# Patient Record
Sex: Male | Born: 1950 | Race: Black or African American | Hispanic: No | Marital: Married | Smoking: Never smoker
Health system: Southern US, Community
[De-identification: ages and names within clinical notes are randomized; demographics above are authoritative.]

## PROBLEM LIST (undated history)

## (undated) DIAGNOSIS — R7303 Prediabetes: Secondary | ICD-10-CM

## (undated) DIAGNOSIS — J45909 Unspecified asthma, uncomplicated: Secondary | ICD-10-CM

## (undated) DIAGNOSIS — E785 Hyperlipidemia, unspecified: Secondary | ICD-10-CM

## (undated) DIAGNOSIS — I10 Essential (primary) hypertension: Secondary | ICD-10-CM

## (undated) DIAGNOSIS — I4891 Unspecified atrial fibrillation: Secondary | ICD-10-CM

## (undated) HISTORY — DX: Essential (primary) hypertension: I10

## (undated) HISTORY — DX: Hyperlipidemia, unspecified: E78.5

---

## 2004-12-11 ENCOUNTER — Ambulatory Visit: Payer: Self-pay | Admitting: Unknown Physician Specialty

## 2007-12-01 ENCOUNTER — Other Ambulatory Visit: Payer: Self-pay

## 2007-12-01 ENCOUNTER — Ambulatory Visit: Payer: Self-pay | Admitting: Ophthalmology

## 2007-12-22 ENCOUNTER — Ambulatory Visit: Payer: Self-pay | Admitting: Ophthalmology

## 2010-05-09 ENCOUNTER — Ambulatory Visit: Payer: Self-pay | Admitting: Unknown Physician Specialty

## 2010-05-10 LAB — PATHOLOGY REPORT

## 2010-07-24 ENCOUNTER — Ambulatory Visit: Payer: Self-pay | Admitting: General Practice

## 2010-08-29 ENCOUNTER — Ambulatory Visit: Payer: Self-pay | Admitting: General Practice

## 2012-04-22 DIAGNOSIS — I10 Essential (primary) hypertension: Secondary | ICD-10-CM | POA: Insufficient documentation

## 2012-04-22 DIAGNOSIS — J329 Chronic sinusitis, unspecified: Secondary | ICD-10-CM | POA: Insufficient documentation

## 2012-04-22 DIAGNOSIS — E785 Hyperlipidemia, unspecified: Secondary | ICD-10-CM | POA: Insufficient documentation

## 2012-05-13 ENCOUNTER — Ambulatory Visit: Payer: Self-pay | Admitting: Ophthalmology

## 2012-05-13 LAB — POTASSIUM: Potassium: 4.1 mmol/L (ref 3.5–5.1)

## 2012-05-19 ENCOUNTER — Ambulatory Visit: Payer: Self-pay | Admitting: Ophthalmology

## 2013-10-15 ENCOUNTER — Ambulatory Visit: Payer: Self-pay | Admitting: General Practice

## 2014-12-28 NOTE — Op Note (Signed)
PATIENT NAME:  Mike Miller, Mike Miller MR#:  [id number] DATE OF BIRTH:  12/17/1950  DATE OF PROCEDURE:  05/19/2012  PREOPERATIVE DIAGNOSIS:  Cataract, right eye.   POSTOPERATIVE DIAGNOSIS:  Cataract, right eye.  PROCEDURE PERFORMED:  Extracapsular cataract extraction using phacoemulsification with placement of an Alcon [id number], 18.0-diopter posterior chamber lens, serial #[id number].  SURGEON:  Maylon PeppersSteven A. , MD  ASSISTANT:  None.  ANESTHESIA:  4% lidocaine and 0.75% Marcaine in a 50/50 mixture with 10 units/mL of Hyalex added, given as a peribulbar.   ANESTHESIOLOGIST: Dr. Dimple Caseyice.   COMPLICATIONS:  None.  ESTIMATED BLOOD LOSS:  Less than 1 ml.  DESCRIPTION OF PROCEDURE:  The patient was brought to the operating room and given a peribulbar block.  The patient was then prepped and draped in the usual fashion.  The vertical rectus muscles were imbricated using 5-0 silk sutures.  These sutures were then clamped to the sterile drapes as bridle sutures.  A limbal peritomy was performed extending two clock hours and hemostasis was obtained with cautery.  A partial thickness scleral groove was made at the surgical limbus and dissected anteriorly in a lamellar dissection using an Alcon crescent knife.  The anterior chamber was entered superonasally with a Superblade and through the lamellar dissection with a 2.6 mm keratome.  DisCoVisc was used to replace the aqueous and a continuous tear capsulorrhexis was carried out.  Hydrodissection and hydrodelineation were carried out with balanced salt and a 27 gauge canula.  The nucleus was rotated to confirm the effectiveness of the hydrodissection.  Phacoemulsification was carried out using a divide-and-conquer technique.  Total ultrasound time was one minute and 10.6 seconds with an average power of 13.8 percent.  CDE of 20.54.   Irrigation/aspiration was used to remove the residual cortex.  DisCoVisc was used to inflate the capsule and the internal  incision was enlarged to 3 mm with the crescent knife.  The intraocular lens was folded and inserted into the capsular bag using the AcrySert delivery system.  Irrigation/aspiration was used to remove the residual DisCoVisc.  Miostat was injected into the anterior chamber through the paracentesis track to inflate the anterior chamber and induce miosis.  The wound was checked for leaks and none were found. The conjunctiva was closed with cautery and the bridle sutures were removed.  Two drops of 0.3% Vigamox were placed on the eye.   An eye shield was placed on the eye.  The patient was discharged to the recovery room in good condition.   ____________________________ Maylon PeppersSteven A. , MD sad:ap D: 05/19/2012 13:39:17 ET T: 05/19/2012 14:04:00 ET JOB#: [id number]  cc: Viviann SpareSteven A. , MD, <Dictator> Erline LevineSTEVEN A  MD ELECTRONICALLY SIGNED 06/09/2012 12:33

## 2015-05-27 ENCOUNTER — Encounter: Payer: Self-pay | Admitting: *Deleted

## 2015-05-30 ENCOUNTER — Ambulatory Visit: Payer: BLUE CROSS/BLUE SHIELD | Admitting: *Deleted

## 2015-05-30 ENCOUNTER — Ambulatory Visit
Admission: RE | Admit: 2015-05-30 | Discharge: 2015-05-30 | Disposition: A | Discharge status: Home / Self Care | Payer: BLUE CROSS/BLUE SHIELD | Source: Ambulatory Visit | Attending: Unknown Physician Specialty | Admitting: Unknown Physician Specialty

## 2015-05-30 ENCOUNTER — Encounter
Admission: RE | Disposition: A | Discharge status: Home / Self Care | Payer: Self-pay | Source: Ambulatory Visit | Attending: Unknown Physician Specialty

## 2015-05-30 DIAGNOSIS — Z7982 Long term (current) use of aspirin: Secondary | ICD-10-CM | POA: Insufficient documentation

## 2015-05-30 DIAGNOSIS — Z79899 Other long term (current) drug therapy: Secondary | ICD-10-CM | POA: Diagnosis not present

## 2015-05-30 DIAGNOSIS — K640 First degree hemorrhoids: Secondary | ICD-10-CM | POA: Insufficient documentation

## 2015-05-30 DIAGNOSIS — J45909 Unspecified asthma, uncomplicated: Secondary | ICD-10-CM | POA: Diagnosis not present

## 2015-05-30 DIAGNOSIS — Z7951 Long term (current) use of inhaled steroids: Secondary | ICD-10-CM | POA: Insufficient documentation

## 2015-05-30 DIAGNOSIS — Z1211 Encounter for screening for malignant neoplasm of colon: Secondary | ICD-10-CM | POA: Diagnosis present

## 2015-05-30 DIAGNOSIS — Z8371 Family history of colonic polyps: Secondary | ICD-10-CM | POA: Insufficient documentation

## 2015-05-30 HISTORY — PX: COLONOSCOPY WITH PROPOFOL: SHX5780

## 2015-05-30 HISTORY — DX: Unspecified asthma, uncomplicated: J45.909

## 2015-05-30 SURGERY — COLONOSCOPY WITH PROPOFOL
Anesthesia: General

## 2015-05-30 MED ORDER — PROPOFOL INFUSION 10 MG/ML OPTIME
INTRAVENOUS | Status: DC | PRN
  Administered 2015-05-30: 100 ug/kg/min via INTRAVENOUS

## 2015-05-30 MED ORDER — SODIUM CHLORIDE 0.9 % IV SOLN
INTRAVENOUS | Status: DC
Start: 2015-05-30 — End: 2015-05-30
  Administered 2015-05-30: 1000 mL via INTRAVENOUS

## 2015-05-30 MED ORDER — PROPOFOL 10 MG/ML IV BOLUS
INTRAVENOUS | Status: DC | PRN
  Administered 2015-05-30: 30 mg via INTRAVENOUS
  Administered 2015-05-30: 20 mg via INTRAVENOUS

## 2015-05-30 MED ORDER — SODIUM CHLORIDE 0.9 % IV SOLN: INTRAVENOUS | Status: DC

## 2015-05-30 MED ORDER — LIDOCAINE HCL (PF) 2 % IJ SOLN
INTRAMUSCULAR | Status: DC | PRN
  Administered 2015-05-30: 50 mg

## 2015-05-30 MED ORDER — LIDOCAINE HCL (PF) 1 % IJ SOLN
2.0000 mL | Freq: Once | INTRAMUSCULAR | Status: AC
  Administered 2015-05-30: 0.3 mL via INTRADERMAL

## 2015-05-30 MED ORDER — LIDOCAINE HCL (PF) 1 % IJ SOLN
INTRAMUSCULAR | Status: AC
  Administered 2015-05-30: 0.3 mL via INTRADERMAL
  Filled 2015-05-30: qty 2

## 2015-05-30 MED ORDER — EPHEDRINE SULFATE 50 MG/ML IJ SOLN
INTRAMUSCULAR | Status: DC | PRN
  Administered 2015-05-30: 10 mg via INTRAVENOUS

## 2015-05-30 MED ORDER — FENTANYL CITRATE (PF) 100 MCG/2ML IJ SOLN
INTRAMUSCULAR | Status: DC | PRN
  Administered 2015-05-30: 50 ug via INTRAVENOUS

## 2015-05-30 MED ORDER — MIDAZOLAM HCL 5 MG/5ML IJ SOLN
INTRAMUSCULAR | Status: DC | PRN
  Administered 2015-05-30: 1 mg via INTRAVENOUS

## 2015-05-30 NOTE — Op Note (Addendum)
[facility] Gastroenterology Patient Name: Mike Miller Procedure Date: 05/30/2015 10:47 AM MRN: [id number] Account #: [id number] Date of Birth: 1951-05-27 Admit Type: Outpatient Age: 64 Room: [facility] ENDO ROOM 1 Gender: Male Note Status: Supervisor Override Procedure:         Colonoscopy Indications:       Colon cancer screening in patient at increased risk:                     Family history of colon polyps Providers:         Scot Jun, MD Medicines:         Propofol per Anesthesia Complications:     No immediate complications. Procedure:         Pre-Anesthesia Assessment:                    - After reviewing the risks and benefits, the patient was                     deemed in satisfactory condition to undergo the procedure.                    After obtaining informed consent, the colonoscope was                     passed under direct vision. Throughout the procedure, the                     patient's blood pressure, pulse, and oxygen saturations                     were monitored continuously. The Colonoscope was                     introduced through the anus and advanced to the the cecum,                     identified by appendiceal orifice and ileocecal valve. The                     colonoscopy was performed without difficulty. The patient                     tolerated the procedure well. The quality of the bowel                     preparation was excellent. Findings:      The scope definitely reacheed the cecum but I forgot to take a picture.      Internal hemorrhoids were found during endoscopy. The hemorrhoids were       small and Grade I (internal hemorrhoids that do not prolapse).      The exam was otherwise without abnormality. Impression:        - Internal hemorrhoids.                    - The examination was otherwise normal.                    - No specimens collected. Recommendation:    - Repeat colonoscopy in 5 years for screening  purposes. Scot Jun, MD 05/30/2015 11:15:13 AM This report has been signed electronically. Number of Addenda: 0 Note Initiated On: 05/30/2015 10:47 AM Scope Withdrawal Time: 0 hours 8 minutes 13 seconds  Total Procedure Duration: 0 hours 13 minutes 37 seconds       [facility]

## 2015-05-30 NOTE — Anesthesia Postprocedure Evaluation (Signed)
  Anesthesia Post-op Note  Patient: Mike Miller.  Procedure(s) Performed: Procedure(s): COLONOSCOPY WITH PROPOFOL (N/A)  Anesthesia type:General  Patient location: PACU  Post pain: Pain level controlled  Post assessment: Post-op Vital signs reviewed, Patient's Cardiovascular Status Stable, Respiratory Function Stable, Patent Airway and No signs of Nausea or vomiting  Post vital signs: Reviewed and stable  Last Vitals:  Filed Vitals:   05/30/15 1120  BP: 98/63  Pulse: 52  Temp: 36.1 C  Resp: 15    Level of consciousness: awake, alert  and patient cooperative  Complications: No apparent anesthesia complications

## 2015-05-30 NOTE — Anesthesia Preprocedure Evaluation (Signed)
Anesthesia Evaluation  Patient identified by MRN, date of birth, ID band Patient awake    Reviewed: Allergy & Precautions, NPO status , Patient's Chart, lab work & pertinent test results  Airway Mallampati: I  TM Distance: >3 FB Neck ROM: Full    Dental  (+) Teeth Intact   Pulmonary asthma ,  On albuterol prn, and advair, with no sx this morning.   Pulmonary exam normal        Cardiovascular Exercise Tolerance: Good hypertension, Pt. on medications Normal cardiovascular exam     Neuro/Psych    GI/Hepatic   Endo/Other    Renal/GU      Musculoskeletal   Abdominal   Peds  Hematology   Anesthesia Other Findings   Reproductive/Obstetrics                             Anesthesia Physical Anesthesia Plan  ASA: II  Anesthesia Plan: General   Post-op Pain Management:    Induction: Intravenous  Airway Management Planned: Nasal Cannula  Additional Equipment:   Intra-op Plan:   Post-operative Plan:   Informed Consent: I have reviewed the patients History and Physical, chart, labs and discussed the procedure including the risks, benefits and alternatives for the proposed anesthesia with the patient or authorized representative who has indicated his/her understanding and acceptance.     Plan Discussed with: CRNA  Anesthesia Plan Comments:         Anesthesia Quick Evaluation

## 2015-05-30 NOTE — H&P (Signed)
   Primary Care Physician:  Gilles Chiquito, MD Primary Gastroenterologist:  Dr. Mechele Collin  Pre-Procedure History & Physical: HPI:  Mike Miller. is a 64 y.o. male is here for an colonoscopy.   Past Medical History  Diagnosis Date  . Asthma     No past surgical history on file.  Prior to Admission medications   Medication Sig Start Date End Date Taking? Authorizing Provider  albuterol (PROVENTIL HFA;VENTOLIN HFA) 108 (90 BASE) MCG/ACT inhaler Inhale into the lungs every 6 (six) hours as needed for wheezing or shortness of breath.   Yes Historical Provider, MD  aspirin 325 MG tablet Take 325 mg by mouth daily.   Yes Historical Provider, MD  atorvastatin (LIPITOR) 20 MG tablet Take 20 mg by mouth daily.   Yes Historical Provider, MD  Fluticasone-Salmeterol (ADVAIR) 250-50 MCG/DOSE AEPB Inhale 1 puff into the lungs 2 (two) times daily.   Yes Historical Provider, MD  hydrochlorothiazide (HYDRODIURIL) 25 MG tablet Take 25 mg by mouth daily.   Yes Historical Provider, MD    Allergies as of 04/12/2015  . (Not on File)    No family history on file.  Social History   Social History  . Marital Status: Married    Spouse Name: N/A  . Number of Children: N/A  . Years of Education: N/A   Occupational History  . Not on file.   Social History Main Topics  . Smoking status: Not on file  . Smokeless tobacco: Not on file  . Alcohol Use: Not on file  . Drug Use: Not on file  . Sexual Activity: Not on file   Other Topics Concern  . Not on file   Social History Narrative  . No narrative on file    Review of Systems: See HPI, otherwise negative ROS  Physical Exam: BP 127/76 mmHg  Pulse 60  Temp(Src) 97.3 F (36.3 C) (Tympanic)  Resp 17  Ht  (1.956 m)  Wt 113.399 kg (250 lb)  BMI 29.64 kg/m2  SpO2 100% General:   Alert,  pleasant and cooperative in NAD Head:  Normocephalic and atraumatic. Neck:  Supple; no masses or thyromegaly. Lungs:  Clear throughout to  auscultation.    Heart:  Regular rate and rhythm. Abdomen:  Soft, nontender and nondistended. Normal bowel sounds, without guarding, and without rebound.   Neurologic:  Alert and  oriented x4;  grossly normal neurologically.  Impression/Plan: Mike Miller. is here for an colonoscopy to be performed for colon screening, family hx of colon polyps  Risks, benefits, limitations, and alternatives regarding  colonoscopy have been reviewed with the patient.  Questions have been answered.  All parties agreeable.   Lynnae Prude, MD  05/30/2015, 10:52 AM

## 2015-05-30 NOTE — Transfer of Care (Signed)
Immediate Anesthesia Transfer of Care Note  Patient: Mike Miller.  Procedure(s) Performed: Procedure(s): COLONOSCOPY WITH PROPOFOL (N/A)  Patient Location: PACU  Anesthesia Type:General  Level of Consciousness: sedated  Airway & Oxygen Therapy: Patient Spontanous Breathing and Patient connected to nasal cannula oxygen  Post-op Assessment: Report given to RN and Post -op Vital signs reviewed and stable  Post vital signs: Reviewed and stable  Last Vitals:  Filed Vitals:   05/30/15 1002  BP: 127/76  Pulse: 60  Temp: 36.3 C  Resp: 17    Complications: No apparent anesthesia complications

## 2015-06-01 ENCOUNTER — Encounter: Payer: Self-pay | Admitting: Unknown Physician Specialty

## 2018-10-24 ENCOUNTER — Telehealth: Payer: Self-pay

## 2018-10-24 NOTE — Telephone Encounter (Signed)
Yes please schedule him in any 40 min slot

## 2018-10-24 NOTE — Telephone Encounter (Signed)
LVMTRC 

## 2018-10-24 NOTE — Telephone Encounter (Signed)
Patient called wanting to know if he could reestablish back with [facility]. 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 Patient is willing to do any day except the end of this month due to going out of town. Next month is fine also. Please advise. Thanks! [phone].

## 2018-10-27 NOTE — Telephone Encounter (Signed)
Patient scheduled for this Wednesday.

## 2018-10-29 ENCOUNTER — Ambulatory Visit: Payer: Self-pay | Admitting: Physician Assistant

## 2018-12-30 DIAGNOSIS — R809 Proteinuria, unspecified: Secondary | ICD-10-CM | POA: Insufficient documentation

## 2018-12-30 DIAGNOSIS — E1129 Type 2 diabetes mellitus with other diabetic kidney complication: Secondary | ICD-10-CM | POA: Insufficient documentation

## 2019-06-26 ENCOUNTER — Other Ambulatory Visit: Payer: Self-pay | Admitting: Nephrology

## 2019-06-26 DIAGNOSIS — R809 Proteinuria, unspecified: Secondary | ICD-10-CM

## 2019-06-26 DIAGNOSIS — I10 Essential (primary) hypertension: Secondary | ICD-10-CM

## 2019-07-03 ENCOUNTER — Other Ambulatory Visit: Payer: Self-pay

## 2019-07-03 ENCOUNTER — Ambulatory Visit
Admission: RE | Admit: 2019-07-03 | Discharge: 2019-07-03 | Disposition: A | Discharge status: Home / Self Care | Payer: Medicare Other | Source: Ambulatory Visit | Attending: Nephrology | Admitting: Nephrology

## 2019-07-03 DIAGNOSIS — R809 Proteinuria, unspecified: Secondary | ICD-10-CM | POA: Diagnosis present

## 2019-07-03 DIAGNOSIS — I10 Essential (primary) hypertension: Secondary | ICD-10-CM | POA: Insufficient documentation

## 2019-10-29 ENCOUNTER — Ambulatory Visit: Payer: PRIVATE HEALTH INSURANCE

## 2019-11-02 ENCOUNTER — Ambulatory Visit: Payer: PRIVATE HEALTH INSURANCE | Attending: Family

## 2019-11-02 DIAGNOSIS — Z23 Encounter for immunization: Secondary | ICD-10-CM

## 2019-11-02 NOTE — Progress Notes (Signed)
   [facility]  Name:  Mike Miller.    MRN: [id number] DOB: Apr 14, 1951  11/02/2019  Mr. Mike Miller was observed post Covid-19 immunization for 15 minutes without incidence. He was provided with Vaccine Information Sheet and instruction to access the V-Safe system.   Mike Miller was instructed to call 911 with any severe reactions post vaccine: Marland Kitchen Difficulty breathing  . Swelling of your face and throat  . A fast heartbeat  . A bad rash all over your body  . Dizziness and weakness    Immunizations Administered    Name Date Dose VIS Date Route   Moderna COVID-19 Vaccine 11/02/2019 11:31 AM 0.5 mL 08/11/2019 Intramuscular   Manufacturer: Moderna   Lot: [id number]   NDC: 97530-051-10

## 2019-12-01 ENCOUNTER — Ambulatory Visit: Payer: PRIVATE HEALTH INSURANCE | Attending: Family

## 2019-12-01 DIAGNOSIS — Z23 Encounter for immunization: Secondary | ICD-10-CM

## 2019-12-01 NOTE — Progress Notes (Signed)
   [facility]  Name:  Zelig Gacek.    MRN: [id number] DOB: 05-Sep-1951  12/01/2019  Mr. Slabach was observed post Covid-19 immunization for 15 minutes without incident. He was provided with Vaccine Information Sheet and instruction to access the V-Safe system.   Mr. Hamon was instructed to call 911 with any severe reactions post vaccine: Marland Kitchen Difficulty breathing  . Swelling of face and throat  . A fast heartbeat  . A bad rash all over body  . Dizziness and weakness   Immunizations Administered    Name Date Dose VIS Date Route   Moderna COVID-19 Vaccine 12/01/2019  2:21 PM 0.5 mL 08/11/2019 Intramuscular   Manufacturer: Moderna   Lot: [id number]   NDC: 14970-263-78

## 2020-01-06 ENCOUNTER — Other Ambulatory Visit: Payer: Self-pay

## 2020-01-06 ENCOUNTER — Inpatient Hospital Stay: Payer: Medicare Other | Attending: Oncology | Admitting: Oncology

## 2020-01-06 ENCOUNTER — Inpatient Hospital Stay: Payer: Medicare Other

## 2020-01-06 ENCOUNTER — Encounter (INDEPENDENT_AMBULATORY_CARE_PROVIDER_SITE_OTHER): Payer: Self-pay

## 2020-01-06 ENCOUNTER — Encounter: Payer: Self-pay | Admitting: Oncology

## 2020-01-06 VITALS — BP 139/71 | HR 64 | Temp 96.0°F | Resp 20 | Wt 235.9 lb

## 2020-01-06 DIAGNOSIS — D649 Anemia, unspecified: Secondary | ICD-10-CM | POA: Insufficient documentation

## 2020-01-06 LAB — CBC
HCT: 43.2 % (ref 39.0–52.0)
Hemoglobin: 13.9 g/dL (ref 13.0–17.0)
MCH: 28 pg (ref 26.0–34.0)
MCHC: 32.2 g/dL (ref 30.0–36.0)
MCV: 86.9 fL (ref 80.0–100.0)
Platelets: 302 10*3/uL (ref 150–400)
RBC: 4.97 MIL/uL (ref 4.22–5.81)
RDW: 14.1 % (ref 11.5–15.5)
WBC: 6.2 10*3/uL (ref 4.0–10.5)
nRBC: 0 % (ref 0.0–0.2)

## 2020-01-06 LAB — DAT, POLYSPECIFIC AHG (ARMC ONLY): Polyspecific AHG test: NEGATIVE

## 2020-01-06 LAB — LACTATE DEHYDROGENASE: LDH: 154 U/L (ref 98–192)

## 2020-01-07 LAB — PROTEIN ELECTROPHORESIS, SERUM
A/G Ratio: 1.5 (ref 0.7–1.7)
Albumin ELP: 4.4 g/dL (ref 2.9–4.4)
Alpha-1-Globulin: 0.1 g/dL (ref 0.0–0.4)
Alpha-2-Globulin: 0.5 g/dL (ref 0.4–1.0)
Beta Globulin: 1.1 g/dL (ref 0.7–1.3)
Gamma Globulin: 1.2 g/dL (ref 0.4–1.8)
Globulin, Total: 2.9 g/dL (ref 2.2–3.9)
Total Protein ELP: 7.3 g/dL (ref 6.0–8.5)

## 2020-01-07 LAB — ERYTHROPOIETIN: Erythropoietin: 5.7 m[IU]/mL (ref 2.6–18.5)

## 2020-01-07 LAB — HAPTOGLOBIN: Haptoglobin: 92 mg/dL (ref 32–363)

## 2020-01-07 NOTE — Progress Notes (Signed)
[place]  Telephone:([phone] Fax:(336) [phone]  ID: Gordy Councilman. OB: 10-Apr-1951  MR#: [id number]  WCB#:[id number]  Patient Care Team: Adin Hector, MD as PCP - General (Internal Medicine)  CHIEF COMPLAINT: Anemia, unspecified.  INTERVAL HISTORY: Patient is a 69 year old male who was noted to have a persistent, but mild anemia.  Iron stores, B12, folate were found to be within normal limits.  He is referred for further evaluation.  He currently feels well and is asymptomatic.  He has no weakness and fatigue.  He has no neurologic complaints.  He has a good appetite and denies weight loss.  He has no chest pain, shortness of breath, cough, or hemoptysis.  He denies any nausea, vomiting, constipation, or diarrhea.  He denies any melena or hematochezia.  He has no urinary complaints.  Patient feels at his baseline and offers no specific complaints today.  REVIEW OF SYSTEMS:   Review of Systems  Constitutional: Negative.  Negative for fever, malaise/fatigue and weight loss.  Respiratory: Negative.  Negative for cough, hemoptysis and shortness of breath.   Cardiovascular: Negative.  Negative for chest pain and leg swelling.  Gastrointestinal: Negative.  Negative for abdominal pain and melena.  Genitourinary: Negative.  Negative for hematuria.  Musculoskeletal: Negative.  Negative for back pain.  Skin: Negative.  Negative for rash.  Neurological: Negative.  Negative for dizziness, focal weakness, weakness and headaches.  Psychiatric/Behavioral: Negative.  The patient is not nervous/anxious.     As per HPI. Otherwise, a complete review of systems is negative.  PAST MEDICAL HISTORY: Past Medical History:  Diagnosis Date  . Asthma     PAST SURGICAL HISTORY: Past Surgical History:  Procedure Laterality Date  . COLONOSCOPY WITH PROPOFOL N/A 05/30/2015   Procedure: COLONOSCOPY WITH PROPOFOL;  Surgeon: Manya Silvas, MD;  Location: [facility] ENDOSCOPY;   Service: Endoscopy;  Laterality: N/A;    FAMILY HISTORY: Family History  Problem Relation Age of Onset  . High blood pressure Mother   . High blood pressure Father   . Diabetes Father   . Asthma Sister     ADVANCED DIRECTIVES (Y/N):  N  HEALTH MAINTENANCE: Social History   Tobacco Use  . Smoking status: Never Smoker  . Smokeless tobacco: Never Used  Substance Use Topics  . Alcohol use: Yes    Comment: social 1 or 2 times a month   . Drug use: Not on file     Colonoscopy:  PAP:  Bone density:  Lipid panel:  Allergies  Allergen Reactions  . Pear     Other reaction(s): Unknown    Current Outpatient Medications  Medication Sig Dispense Refill  . albuterol (PROVENTIL HFA;VENTOLIN HFA) 108 (90 BASE) MCG/ACT inhaler Inhale into the lungs every 6 (six) hours as needed for wheezing or shortness of breath.    Marland Kitchen aspirin 325 MG tablet Take 325 mg by mouth daily.    Marland Kitchen atorvastatin (LIPITOR) 20 MG tablet Take 20 mg by mouth daily.    Marland Kitchen azelastine (ASTELIN) 0.1 % nasal spray Place 1 spray into both nostrils daily. Use in each nostril as directed    . brimonidine (ALPHAGAN P) 0.1 % SOLN Place 1 drop into the right eye in the morning and at bedtime.    . fluticasone (FLONASE) 50 MCG/ACT nasal spray Place 1 spray into both nostrils daily.    . Fluticasone-Salmeterol (ADVAIR) 250-50 MCG/DOSE AEPB Inhale 1 puff into the lungs 2 (two) times daily.    Marland Kitchen  telmisartan (MICARDIS) 20 MG tablet Take 20 mg by mouth daily.     No current facility-administered medications for this visit.    OBJECTIVE: Vitals:   01/06/20 1138  BP: 139/71  Pulse: 64  Resp: 20  Temp: (!) 96 F (35.6 C)  SpO2: 100%     Body mass index is 27.97 kg/m.    ECOG FS:0 - Asymptomatic  General: Well-developed, well-nourished, no acute distress. Eyes: Pink conjunctiva, anicteric sclera. HEENT: Normocephalic, moist mucous membranes. Lungs: No audible wheezing or coughing. Heart: Regular rate and  rhythm. Abdomen: Soft, nontender, no obvious distention. Musculoskeletal: No edema, cyanosis, or clubbing. Neuro: Alert, answering all questions appropriately. Cranial nerves grossly intact. Skin: No rashes or petechiae noted. Psych: Normal affect. Lymphatics: No cervical, calvicular, axillary or inguinal LAD.   LAB RESULTS:  Lab Results  Component Value Date   K 4.1 05/13/2012    Lab Results  Component Value Date   WBC 6.2 01/06/2020   HGB 13.9 01/06/2020   HCT 43.2 01/06/2020   MCV 86.9 01/06/2020   PLT 302 01/06/2020     STUDIES: No results found.  ASSESSMENT: Anemia, unspecified.  PLAN:    1.  Anemia, unspecified: Patient's hemoglobin has trended up and is now within normal limits at 13.9.  Previously, iron stores, B12, and folate are within normal limits.  He has no evidence of hemolysis and his erythropoietin levels are appropriately within normal limits.  SPEP was ordered for completeness and is pending at time of dictation.  No intervention is needed at this time.  Patient will have video assisted telemedicine visit in 3 weeks to discuss the results.  I spent a total of 45 minutes reviewing chart data, face-to-face evaluation with the patient, counseling and coordination of care as detailed above.   Patient expressed understanding and was in agreement with this plan. He also understands that He can call clinic at any time with any questions, concerns, or complaints.    Jeralyn Ruths, MD   01/07/2020 10:22 AM

## 2020-01-24 NOTE — Progress Notes (Signed)
[place]  Telephone:([phone] Fax:(336) [phone]  ID: Mike Miller. OB: 1950/11/26  MR#: [id number]  EXB#:[id number]  Patient Care Team: Adin Hector, MD as PCP - General (Internal Medicine) Lloyd Huger, MD as Consulting Physician (Oncology)  I connected with Mike Miller. on 01/26/20 at  1:30 PM EDT by video enabled telemedicine visit and verified that I am speaking with the correct person using two identifiers.   I discussed the limitations, risks, security and privacy concerns of performing an evaluation and management service by telemedicine and the availability of in-person appointments. I also discussed with the patient that there may be a patient responsible charge related to this service. The patient expressed understanding and agreed to proceed.   Other persons participating in the visit and their role in the encounter: Patient, MD.  Patient's location: Home. Provider's location: Clinic.  CHIEF COMPLAINT: Anemia, resolved.  INTERVAL HISTORY: Patient agreed to video enabled telemedicine visit for further evaluation and discussion of his laboratory work.  He continues to feel well and remains asymptomatic. He has no neurologic complaints.  He has a good appetite and denies weight loss.  He has no chest pain, shortness of breath, cough, or hemoptysis.  He denies any nausea, vomiting, constipation, or diarrhea.  He denies any melena or hematochezia.  He has no urinary complaints.  Patient offers no specific complaints today.  REVIEW OF SYSTEMS:   Review of Systems  Constitutional: Negative.  Negative for fever, malaise/fatigue and weight loss.  Respiratory: Negative.  Negative for cough, hemoptysis and shortness of breath.   Cardiovascular: Negative.  Negative for chest pain and leg swelling.  Gastrointestinal: Negative.  Negative for abdominal pain and melena.  Genitourinary: Negative.  Negative for hematuria.  Musculoskeletal:  Negative.  Negative for back pain.  Skin: Negative.  Negative for rash.  Neurological: Negative.  Negative for dizziness, focal weakness, weakness and headaches.  Psychiatric/Behavioral: Negative.  The patient is not nervous/anxious.     As per HPI. Otherwise, a complete review of systems is negative.  PAST MEDICAL HISTORY: Past Medical History:  Diagnosis Date  . Asthma     PAST SURGICAL HISTORY: Past Surgical History:  Procedure Laterality Date  . COLONOSCOPY WITH PROPOFOL N/A 05/30/2015   Procedure: COLONOSCOPY WITH PROPOFOL;  Surgeon: Manya Silvas, MD;  Location: [facility] ENDOSCOPY;  Service: Endoscopy;  Laterality: N/A;    FAMILY HISTORY: Family History  Problem Relation Age of Onset  . High blood pressure Mother   . High blood pressure Father   . Diabetes Father   . Asthma Sister     ADVANCED DIRECTIVES (Y/N):  N  HEALTH MAINTENANCE: Social History   Tobacco Use  . Smoking status: Never Smoker  . Smokeless tobacco: Never Used  Substance Use Topics  . Alcohol use: Yes    Comment: social 1 or 2 times a month   . Drug use: Not on file     Colonoscopy:  PAP:  Bone density:  Lipid panel:  Allergies  Allergen Reactions  . Pear     Other reaction(s): Unknown    Current Outpatient Medications  Medication Sig Dispense Refill  . albuterol (PROVENTIL HFA;VENTOLIN HFA) 108 (90 BASE) MCG/ACT inhaler Inhale into the lungs every 6 (six) hours as needed for wheezing or shortness of breath.    Marland Kitchen aspirin 325 MG tablet Take 325 mg by mouth daily.    Marland Kitchen atorvastatin (LIPITOR) 20 MG tablet Take 20 mg by mouth daily.    Marland Kitchen  azelastine (ASTELIN) 0.1 % nasal spray Place 1 spray into both nostrils daily. Use in each nostril as directed    . brimonidine (ALPHAGAN P) 0.1 % SOLN Place 1 drop into the right eye in the morning and at bedtime.    . fluticasone (FLONASE) 50 MCG/ACT nasal spray Place 1 spray into both nostrils daily.    . Fluticasone-Salmeterol (ADVAIR) 250-50  MCG/DOSE AEPB Inhale 1 puff into the lungs 2 (two) times daily.    Marland Kitchen telmisartan (MICARDIS) 20 MG tablet Take 20 mg by mouth daily.     No current facility-administered medications for this visit.    OBJECTIVE: There were no vitals filed for this visit.   There is no height or weight on file to calculate BMI.    ECOG FS:0 - Asymptomatic  General: Well-developed, well-nourished, no acute distress. HEENT: Normocephalic. Neuro: Alert, answering all questions appropriately. Cranial nerves grossly intact. Psych: Normal affect.  LAB RESULTS:  Lab Results  Component Value Date   K 4.1 05/13/2012    Lab Results  Component Value Date   WBC 6.2 01/06/2020   HGB 13.9 01/06/2020   HCT 43.2 01/06/2020   MCV 86.9 01/06/2020   PLT 302 01/06/2020     STUDIES: No results found.  ASSESSMENT: Anemia, resolved.Marland Kitchen  PLAN:    1.  Anemia, resolved: Patient's most recent hemoglobin is within normal limits at 13.9.  All of his other laboratory work including iron stores, B12, folate, hemolysis labs, and SPEP are all either negative or within normal limits.  No intervention is needed at this time.  Patient does not require follow-up.  Please refer patient back if there are any questions or concerns.    I provided 20 minutes of face-to-face video visit time during this encounter which included chart review, counseling, and coordination of care as documented above.  Patient expressed understanding and was in agreement with this plan. He also understands that He can call clinic at any time with any questions, concerns, or complaints.    Jeralyn Ruths, MD   01/26/2020 1:44 PM

## 2020-01-26 ENCOUNTER — Encounter: Payer: Self-pay | Admitting: Oncology

## 2020-01-26 ENCOUNTER — Inpatient Hospital Stay: Payer: Medicare Other | Attending: Oncology | Admitting: Oncology

## 2020-01-26 DIAGNOSIS — D649 Anemia, unspecified: Secondary | ICD-10-CM | POA: Diagnosis not present

## 2020-01-26 NOTE — Progress Notes (Signed)
Patient reports no pain or concerns at this time.  

## 2021-02-05 IMAGING — US US RENAL
1 series · 14 of 25 positions shown · non-contrast
Comparison: None

CLINICAL DATA: Hypertension, proteinuria

EXAM:
RENAL / URINARY TRACT ULTRASOUND COMPLETE

[Series 1: us renal · 0.23mm/px · 14 of 39 slices shown]
[im 1/39]
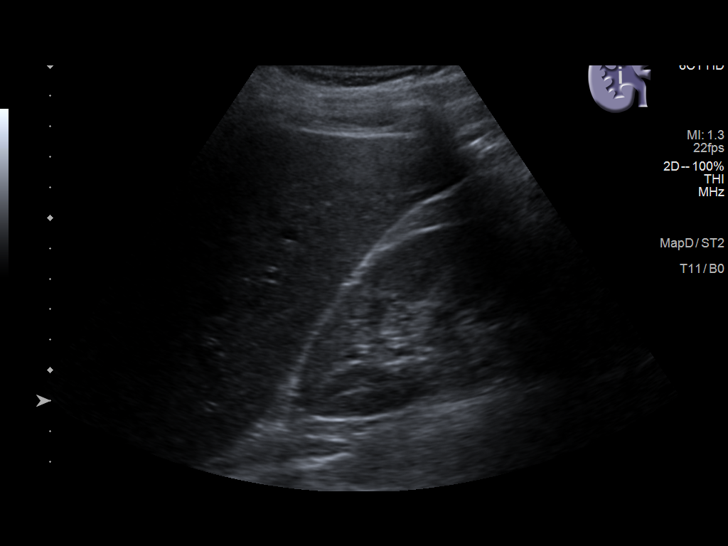
[im 4/39]
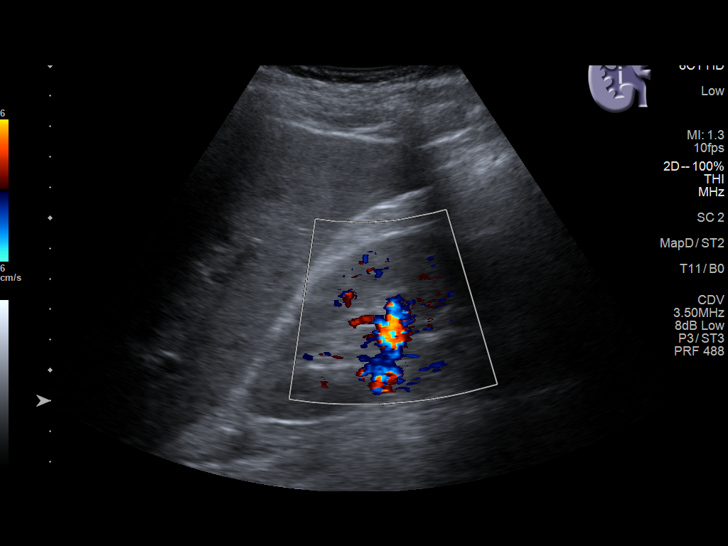
[im 7/39]
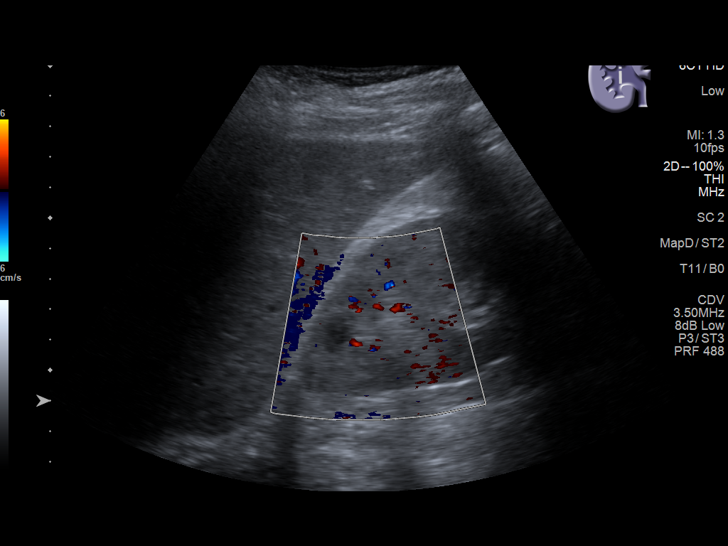
[im 10/39]
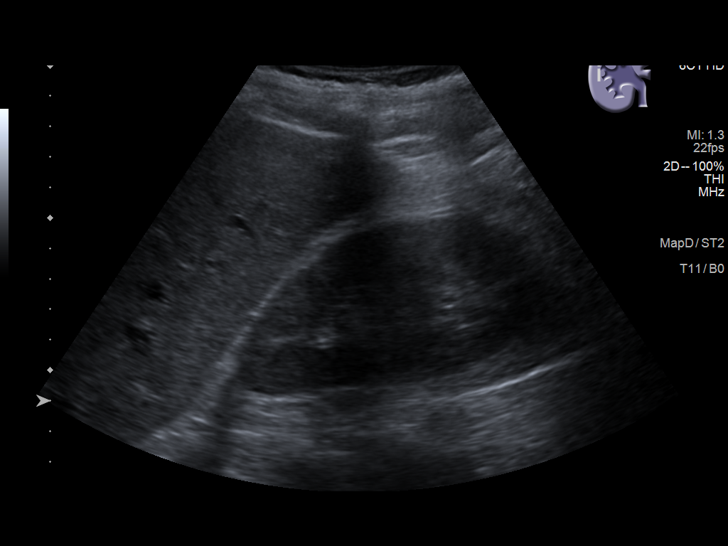
[im 13/39]
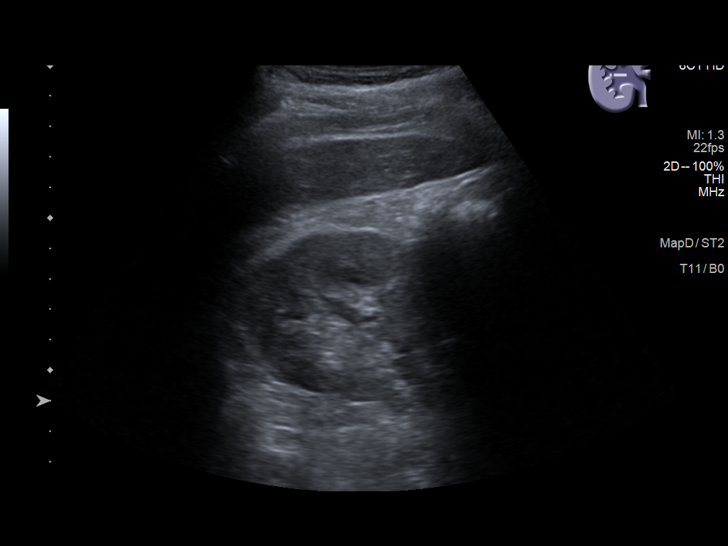
[im 15/39]
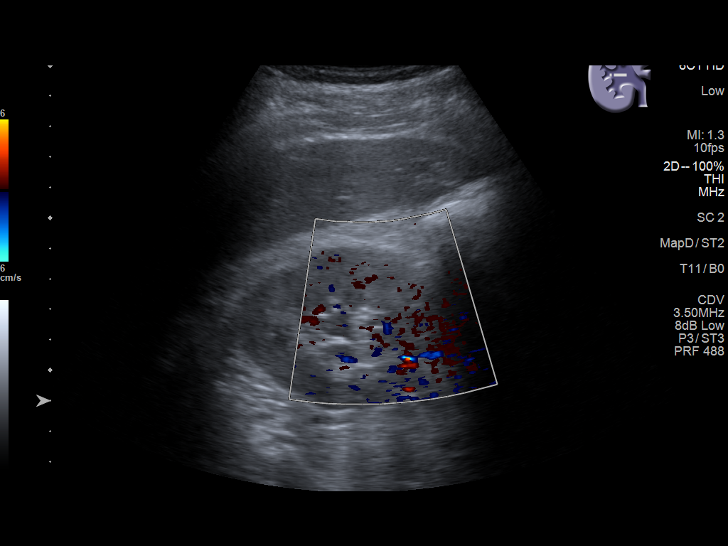
[im 18/39]
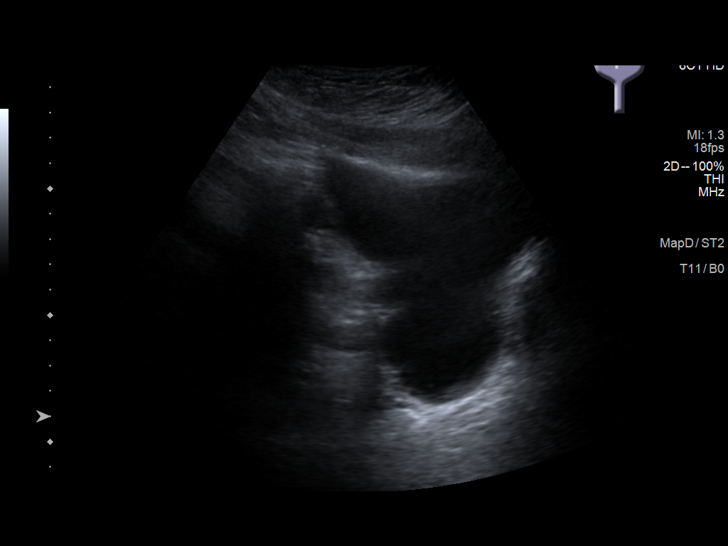
[im 21/39]
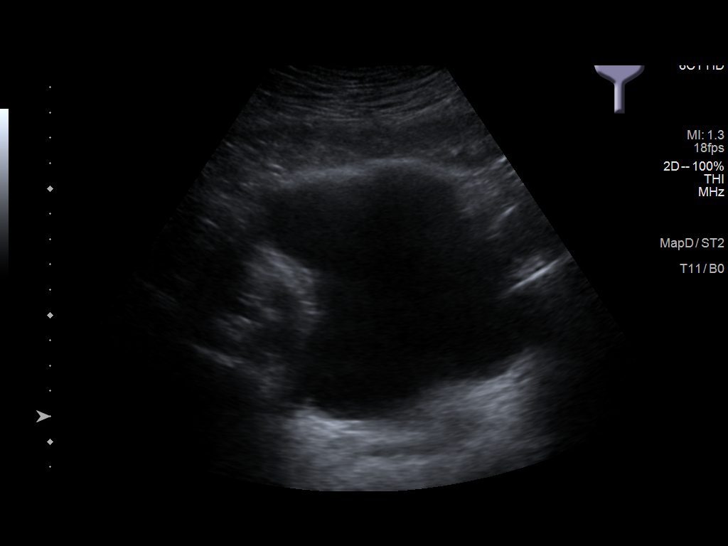
[im 24/39]
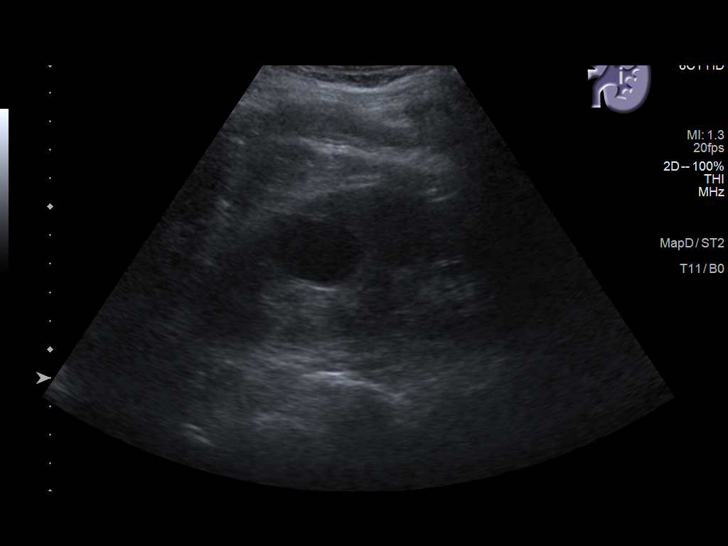
[im 26/39]
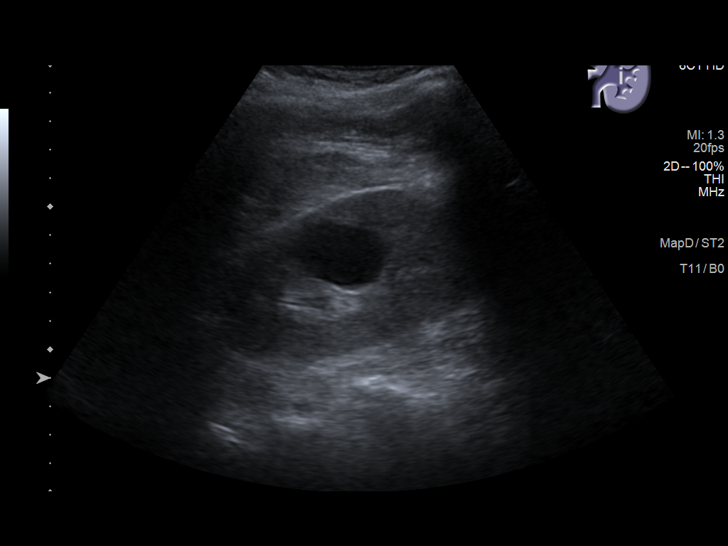
[im 29/39]
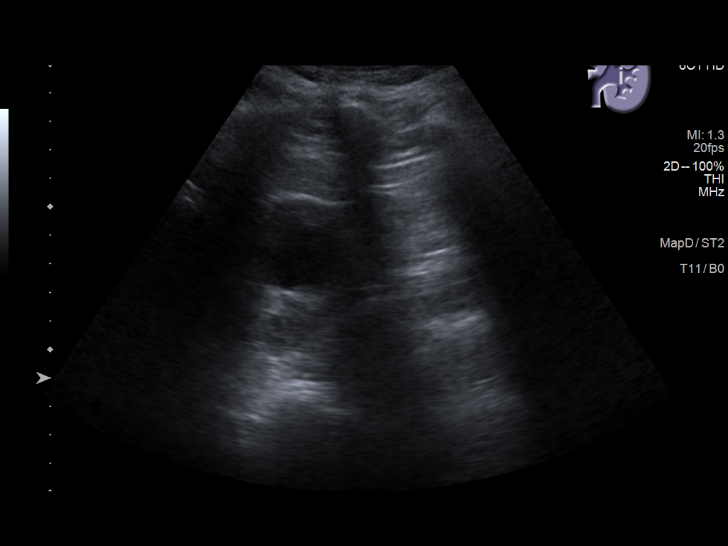
[im 32/39]
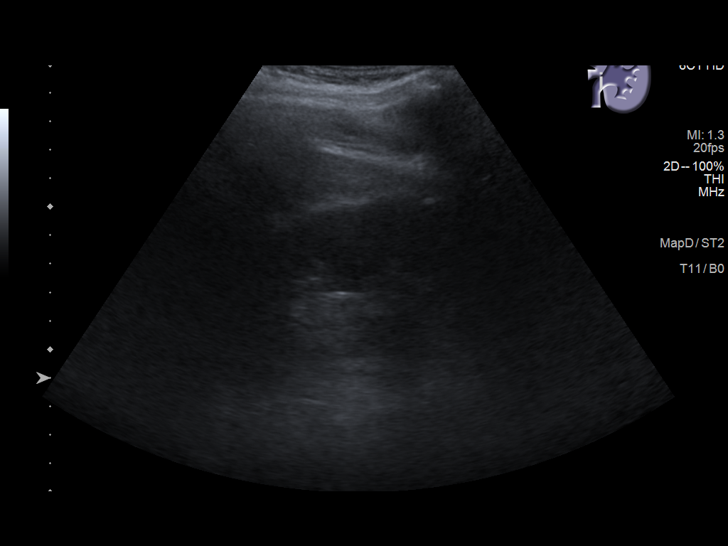
[im 35/39]
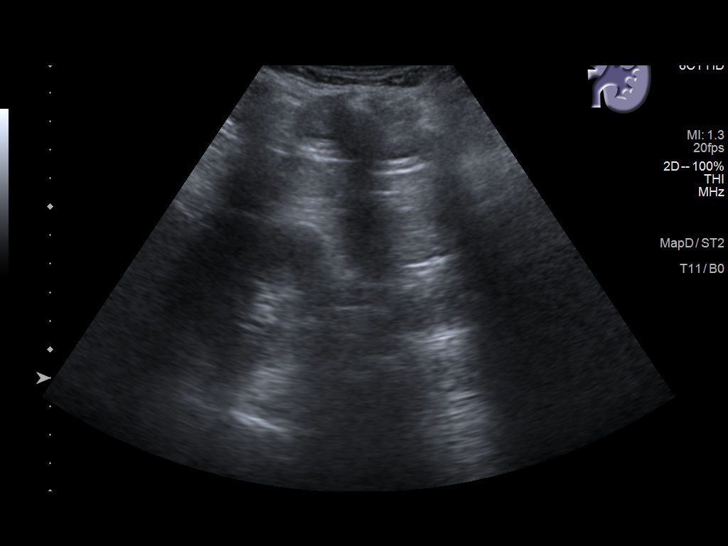
[im 39/39]
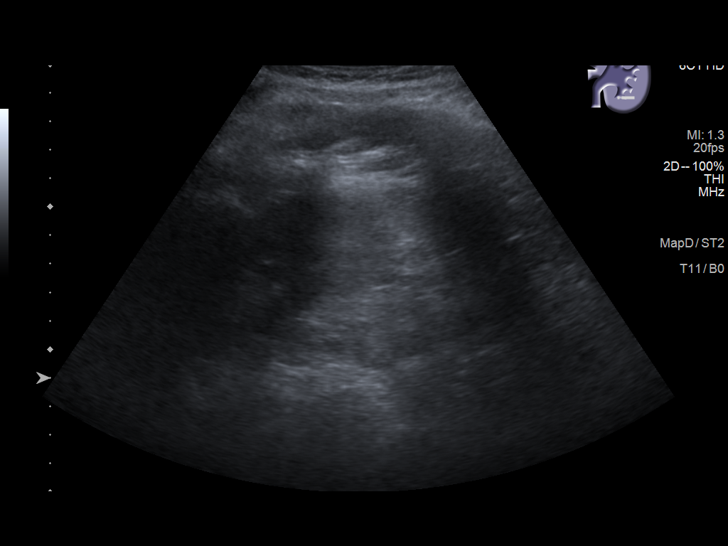

[14 of 25 positions shown; findings below may reference images not displayed]

FINDINGS: Right Kidney:

Renal measurements: 12.9 x 5.9 x 6.5 cm = volume: 259 mL. Normal
cortical thickness. Increased cortical echogenicity. Small cyst at
upper pole 10 x 12 x 12 mm. No additional mass, hydronephrosis or
shadowing calcification.

Left Kidney:

Renal measurements: 12.2 x 5.5 x 5.6 cm = volume: 195 mL. Normal
cortical thickness. Increased cortical echogenicity. Mid renal cyst
3.4 x 2.2 x 2.8 cm, simple features. No additional mass,
hydronephrosis, or shadowing calcification.

Bladder:

Appears normal for degree of bladder distention.

Other:

N/A
IMPRESSION: Medical renal disease changes of both kidneys.

BILATERAL renal cysts larger on LEFT.

## 2023-02-08 ENCOUNTER — Emergency Department
Admission: EM | Admit: 2023-02-08 | Discharge: 2023-02-08 | Disposition: A | Discharge status: Home / Self Care | Payer: Medicare Other | Attending: Emergency Medicine | Admitting: Emergency Medicine

## 2023-02-08 ENCOUNTER — Emergency Department: Payer: Medicare Other

## 2023-02-08 ENCOUNTER — Other Ambulatory Visit: Payer: Self-pay

## 2023-02-08 DIAGNOSIS — R0609 Other forms of dyspnea: Secondary | ICD-10-CM | POA: Insufficient documentation

## 2023-02-08 DIAGNOSIS — R002 Palpitations: Secondary | ICD-10-CM

## 2023-02-08 DIAGNOSIS — I4891 Unspecified atrial fibrillation: Secondary | ICD-10-CM | POA: Diagnosis not present

## 2023-02-08 DIAGNOSIS — J069 Acute upper respiratory infection, unspecified: Secondary | ICD-10-CM | POA: Diagnosis not present

## 2023-02-08 DIAGNOSIS — J45909 Unspecified asthma, uncomplicated: Secondary | ICD-10-CM | POA: Diagnosis not present

## 2023-02-08 LAB — CBC WITH DIFFERENTIAL/PLATELET
Abs Immature Granulocytes: 0.03 10*3/uL (ref 0.00–0.07)
Basophils Absolute: 0.1 10*3/uL (ref 0.0–0.1)
Basophils Relative: 1 %
Eosinophils Absolute: 0.3 10*3/uL (ref 0.0–0.5)
Eosinophils Relative: 4 %
HCT: 48.7 % (ref 39.0–52.0)
Hemoglobin: 15.3 g/dL (ref 13.0–17.0)
Immature Granulocytes: 0 %
Lymphocytes Relative: 20 %
Lymphs Abs: 1.7 10*3/uL (ref 0.7–4.0)
MCH: 27.6 pg (ref 26.0–34.0)
MCHC: 31.4 g/dL (ref 30.0–36.0)
MCV: 87.9 fL (ref 80.0–100.0)
Monocytes Absolute: 1.1 10*3/uL — ABNORMAL HIGH (ref 0.1–1.0)
Monocytes Relative: 12 %
Neutro Abs: 5.3 10*3/uL (ref 1.7–7.7)
Neutrophils Relative %: 63 %
Platelets: 307 10*3/uL (ref 150–400)
RBC: 5.54 MIL/uL (ref 4.22–5.81)
RDW: 14.2 % (ref 11.5–15.5)
WBC: 8.6 10*3/uL (ref 4.0–10.5)
nRBC: 0 % (ref 0.0–0.2)

## 2023-02-08 LAB — TROPONIN I (HIGH SENSITIVITY): Troponin I (High Sensitivity): 7 ng/L (ref <18)

## 2023-02-08 LAB — COMPREHENSIVE METABOLIC PANEL
ALT: 40 U/L (ref 0–44)
AST: 30 U/L (ref 15–41)
Albumin: 4.2 g/dL (ref 3.5–5.0)
Alkaline Phosphatase: 54 U/L (ref 38–126)
Anion gap: 9 (ref 5–15)
BUN: 17 mg/dL (ref 8–23)
CO2: 25 mmol/L (ref 22–32)
Calcium: 9.3 mg/dL (ref 8.9–10.3)
Chloride: 108 mmol/L (ref 98–111)
Creatinine, Ser: 1.27 mg/dL — ABNORMAL HIGH (ref 0.61–1.24)
GFR, Estimated: >60 mL/min (ref >60)
Glucose, Bld: 93 mg/dL (ref 70–99)
Potassium: 3.6 mmol/L (ref 3.5–5.1)
Sodium: 142 mmol/L (ref 135–145)
Total Bilirubin: 0.6 mg/dL (ref 0.3–1.2)
Total Protein: 7.5 g/dL (ref 6.5–8.1)

## 2023-02-08 LAB — BRAIN NATRIURETIC PEPTIDE: B Natriuretic Peptide: 160 pg/mL — ABNORMAL HIGH (ref 0.0–100.0)

## 2023-02-08 MED ORDER — APIXABAN 5 MG PO TABS
5.0000 mg | ORAL_TABLET | Freq: Two times a day (BID) | ORAL | Status: DC
  Administered 2023-02-08: 5 mg via ORAL
  Filled 2023-02-08 (×2): qty 1

## 2023-02-08 MED ORDER — APIXABAN (ELIQUIS) VTE STARTER PACK (10MG AND 5MG): ORAL_TABLET | ORAL | 0 refills | Status: DC

## 2023-02-08 MED ORDER — AZITHROMYCIN 500 MG PO TABS
500.0000 mg | ORAL_TABLET | Freq: Once | ORAL | Status: AC
  Administered 2023-02-08: 500 mg via ORAL
  Filled 2023-02-08: qty 1

## 2023-02-08 MED ORDER — AZITHROMYCIN 250 MG PO TABS: ORAL_TABLET | ORAL | 0 refills | Status: AC

## 2023-02-08 NOTE — Consult Note (Signed)
ANTICOAGULATION CONSULT NOTE - Initial Consult  Pharmacy Consult for apixaban Indication: non-valvular atrial fibrillation  Allergies  Allergen Reactions   Pear     Other reaction(s): Unknown    Patient Measurements:   Wt: 126.6 kg Scr 1.27 Age: 72  Vital Signs: Temp: 98.5 F (36.9 C) (05/31 1725) Temp Source: Oral (05/31 1725) BP: 147/81 (05/31 1900) Pulse Rate: 82 (05/31 1900)  Labs: Recent Labs    02/08/23 1849  HGB 15.3  HCT 48.7  PLT 307  CREATININE 1.27*  TROPONINIHS 7    CrCl cannot be calculated (Unknown ideal weight.).   Medical History: Past Medical History:  Diagnosis Date   Asthma     Medications:  No prior AC noted  Assessment: 72 year old male presenting to ED with chief complaint of palpitations. PMH of HTN, DM, HLD. Pharmacy has been consulted to initiate apixaban therapy.    Goal of Therapy:   Monitor platelets by anticoagulation protocol: Yes   Plan:  Pt Wt: 126.6 kg Scr 1.27 Age: 72  Initiate apixaban 5 mg BID for indication of atrial fibrillation   Sharen Hones, PharmD, BCPS Clinical Pharmacist    02/08/2023,8:39 PM

## 2023-02-08 NOTE — Discharge Instructions (Addendum)
You may continue using Mucinex (guaifenesin) and/or add Coricidin HBP for continued congestion symptoms

## 2023-02-08 NOTE — ED Provider Notes (Signed)
[facility] Provider Note   Event Date/Time   First MD Initiated Contact with Patient 02/08/23 1817     (approximate) History  Palpitations  HPI Mike Miller. is a 72 y.o. male with stated past medical history of asthma who presents from [place] [facility] after he was found to be in new onset A-fib.  Patient is concerned that he may have bronchitis as his wife has recently been diagnosed and this was the reason for having his office visit earlier today.  Patient does endorse palpitations, dyspnea on exertion, and mild shortness of breath that has been present over the last 2 days.  Patient denies any exacerbating relieving factors.  Patient denies any history of of atrial fibrillation or other arrhythmia. ROS: Patient currently denies any vision changes, tinnitus, difficulty speaking, facial droop, sore throat, chest pain, abdominal pain, nausea/vomiting/diarrhea, dysuria, or weakness/numbness/paresthesias in any extremity   Physical Exam  Triage Vital Signs: ED Triage Vitals  Enc Vitals Group     BP 02/08/23 1725 (!) 156/89     Pulse Rate 02/08/23 1725 80     Resp 02/08/23 1725 18     Temp 02/08/23 1725 98.5 F (36.9 C)     Temp Source 02/08/23 1725 Oral     SpO2 02/08/23 1725 97 %     Weight --      Height --      Head Circumference --      Peak Flow --      Pain Score 02/08/23 1726 0     Pain Loc --      Pain Edu? --      Excl. in GC? --    Most recent vital signs: Vitals:   02/08/23 2000 02/08/23 2030  BP: (!) 154/95 (!) 142/73  Pulse: 79 (!) 55  Resp: 16 17  Temp:    SpO2: 98% 98%   General: Awake, oriented x4. CV:  Good peripheral perfusion.  Resp:  Normal effort.  Abd:  No distention.  Other:  Elderly overweight African-American male laying in bed in no acute distress ED Results / Procedures / Treatments  Labs (all labs ordered are listed, but only abnormal results are displayed) Labs Reviewed  COMPREHENSIVE METABOLIC PANEL -  Abnormal; Notable for the following components:      Result Value   Creatinine, Ser 1.27 (*)    All other components within normal limits  CBC WITH DIFFERENTIAL/PLATELET - Abnormal; Notable for the following components:   Monocytes Absolute 1.1 (*)    All other components within normal limits  BRAIN NATRIURETIC PEPTIDE - Abnormal; Notable for the following components:   B Natriuretic Peptide 160.0 (*)    All other components within normal limits  TROPONIN I (HIGH SENSITIVITY)  TROPONIN I (HIGH SENSITIVITY)   EKG ED ECG REPORT I, Merwyn Katos, the attending physician, personally viewed and interpreted this ECG. Date: 02/08/2023 EKG Time: 1728 Rate: 104 Rhythm: Atrial fibrillation QRS Axis: normal Intervals: normal ST/T Wave abnormalities: normal Narrative Interpretation: no evidence of acute ischemia RADIOLOGY ED MD interpretation: Single view portable chest x-ray interpreted independently by me and shows interstitial changes that are new likely related to patient's upper respiratory infection -Agree with radiology assessment Official radiology report(s): DG Chest Port 1 View  Result Date: 02/08/2023 CLINICAL DATA:  Atrial fibrillation. EXAM: PORTABLE CHEST 1 VIEW COMPARISON:  X-ray 07/24/2010 FINDINGS: No consolidation, pneumothorax or effusion. No edema. Normal cardiopericardial silhouette. Interstitial changes are seen which could be chronic.  Slight elevation of the right hemidiaphragm. Degenerative changes of the spine. IMPRESSION: Slight elevation of the right hemidiaphragm. Interstitial changes are seen which are new from previous but still could be chronic. Please correlate with clinical presentation and follow-up to exclude an acute process Electronically Signed   By: Karen Kays M.D.   On: 02/08/2023 18:31   PROCEDURES: Critical Care performed: No .1-3 Lead EKG Interpretation  Performed by: Merwyn Katos, MD Authorized by: Merwyn Katos, MD     Interpretation:  abnormal     ECG rate:  84   ECG rate assessment: normal     Rhythm: atrial fibrillation     Ectopy: none     Conduction: normal    MEDICATIONS ORDERED IN ED: Medications  apixaban (ELIQUIS) tablet 5 mg (5 mg Oral Given 02/08/23 2043)  azithromycin (ZITHROMAX) tablet 500 mg (500 mg Oral Given 02/08/23 2043)   IMPRESSION / MDM / ASSESSMENT AND PLAN / ED COURSE  I reviewed the triage vital signs and the nursing notes.                             The patient is on the cardiac monitor to evaluate for evidence of arrhythmia and/or significant heart rate changes. Patient's presentation is most consistent with acute presentation with potential threat to life or bodily function. + atrial fibrillation w/ RVR DDx: Pneumothorax, Pneumonia, Pulmonary Embolus, Tamponade, ACS, Thyrotoxicosis.  No history or evidence decompensated heart failure. Given their history and exam it is likely this patient is unlikely to spontaneously revert to a rate controlled rhythm and necessitates a thorough workup for their arrhythmia. Workup: ECG, CXR, CBC, BMP, UA, Troponin, BNP, TSH, Ca-Mag-Phos Interventions: Defer Cardioversion (uncertain historical reliability with time of onset, increased risk of thromboembolic stroke).    Reassessment: Patient maintained NSR during multi-hour observation in ED. Disposition: Discharge home with prompt PCP follow up and cardiology referral.    FINAL CLINICAL IMPRESSION(S) / ED DIAGNOSES   Final diagnoses:  Atrial fibrillation, unspecified type (HCC)  Heart palpitations  Upper respiratory tract infection, unspecified type   Rx / DC Orders   ED Discharge Orders          Ordered    Ambulatory referral to Cardiology       Comments: If you have not heard from the Cardiology office within the next 72 hours please call [phone].   02/08/23 2001    APIXABAN (ELIQUIS) VTE STARTER PACK (10MG  AND 5MG )       Note to Pharmacy: If starter pack unavailable, substitute with  seventy-four 5 mg apixaban tabs following the above SIG directions.   02/08/23 2003    azithromycin (ZITHROMAX Z-PAK) 250 MG tablet        02/08/23 2003           Note:  This document was prepared using Dragon voice recognition software and may include unintentional dictation errors.   Merwyn Katos, MD 02/08/23 [phone]

## 2023-02-08 NOTE — ED Triage Notes (Addendum)
Pt was seen at [facility] today bc he was "feeling funny" and c/o mild chest pain and congestion. Pt was referred to ED for a-fibb and possible bronchitis. Pt states wife had bronchitis. Labs done at [facility].

## 2023-02-12 ENCOUNTER — Encounter: Payer: Self-pay | Admitting: Cardiovascular Disease

## 2023-02-12 ENCOUNTER — Ambulatory Visit: Payer: Medicare Other

## 2023-02-12 ENCOUNTER — Ambulatory Visit: Payer: Medicare Other | Attending: Cardiovascular Disease | Admitting: Cardiovascular Disease

## 2023-02-12 VITALS — BP 138/68 | HR 63 | Ht 77.0 in | Wt 278.5 lb

## 2023-02-12 DIAGNOSIS — I480 Paroxysmal atrial fibrillation: Secondary | ICD-10-CM | POA: Diagnosis present

## 2023-02-12 DIAGNOSIS — J45909 Unspecified asthma, uncomplicated: Secondary | ICD-10-CM | POA: Insufficient documentation

## 2023-02-12 MED ORDER — APIXABAN 5 MG PO TABS
5.0000 mg | ORAL_TABLET | Freq: Two times a day (BID) | ORAL | 6 refills | Status: DC
Start: 2023-02-12 — End: 2023-09-05

## 2023-02-12 MED ORDER — METOPROLOL SUCCINATE ER 25 MG PO TB24
12.5000 mg | ORAL_TABLET | Freq: Every day | ORAL | 3 refills | Status: DC
Start: 2023-02-12 — End: 2023-04-08

## 2023-02-12 NOTE — Progress Notes (Signed)
Cardiology Office Note  Date:  02/12/2023   ID:  Erline Levine., DOB 11/08/1950, MRN [id number]  PCP:  Lynnea Ferrier, MD   Chief Complaint  Patient presents with   New Patient (Initial Visit)    Follow up [facility]; new A-Fib and shortness of breath. Medications reviewed by the patient verbally.     HPI:  Mr. Jibri Fouraker, Montez Hageman. is a 72 year old gentleman with past medical history of Asthma Seen in the emergency room Feb 08, 2023 for atrial fibrillation Who presents for new patient evaluation by referral Dr. Worthy Keeler for atrial fibrillation  Reported having shortness of breath, chest tightness for 2 days before being seen in the ER EKG in the ER documenting atrial fibrillation ventricular rate 104 bpm Received Eliquis prescription, Z-Pak for bronchitis Dose of Eliquis received from the ER 10 twice daily for 1 week then down to 5 twice daily (appropriate dosing for DVT and PE, though not for atrial fibrillation) Notes indicate he converted to normal sinus rhythm without intervention in the emergency room  On discussions today, reports feeling back to his baseline, almost completed his antibiotics, 1 more day Denies any further tightness in his chest Denies tachypalpitations concerning for arrhythmia  Active at baseline, no limitations  EKG personally reviewed by myself on todays visit Normal sinus rhythm with rate 74 bpm no significant ST-T wave changes   PMH:   has a past medical history of Asthma, Hyperlipidemia, and Hypertension.  PSH:    Past Surgical History:  Procedure Laterality Date   COLONOSCOPY WITH PROPOFOL N/A 05/30/2015   Procedure: COLONOSCOPY WITH PROPOFOL;  Surgeon: Scot Jun, MD;  Location: [facility] ENDOSCOPY;  Service: Endoscopy;  Laterality: N/A;    Current Outpatient Medications  Medication Sig Dispense Refill   APIXABAN (ELIQUIS) VTE STARTER PACK (10MG  AND 5MG ) Take as directed on package: start with two-5mg  tablets twice daily for 7 days. On day 8,  switch to one-5mg  tablet twice daily. 74 each 0   aspirin 325 MG tablet Take 325 mg by mouth daily.     atorvastatin (LIPITOR) 20 MG tablet Take 20 mg by mouth daily.     azelastine (ASTELIN) 0.1 % nasal spray Place 1 spray into both nostrils daily. Use in each nostril as directed     azithromycin (ZITHROMAX Z-PAK) 250 MG tablet Take 2 tablets (500 mg) on  Day 1,  followed by 1 tablet (250 mg) once daily on Days 2 through 5. (Patient taking differently: Take 2 tablets (500 mg) on  Day 1,  followed by 1 tablet (250 mg) once daily on Days 2 through 5.) 6 each 0   brimonidine (ALPHAGAN P) 0.1 % SOLN Place 1 drop into the right eye in the morning and at bedtime.     fluticasone (FLONASE) 50 MCG/ACT nasal spray Place 1 spray into both nostrils daily.     Fluticasone-Salmeterol (ADVAIR) 250-50 MCG/DOSE AEPB Inhale 1 puff into the lungs 2 (two) times daily.     pseudoephedrine-guaifenesin (MUCINEX D) 60-600 MG 12 hr tablet Take 1 tablet by mouth every 12 (twelve) hours. X 7 days     telmisartan (MICARDIS) 20 MG tablet Take 20 mg by mouth daily.     albuterol (PROVENTIL HFA;VENTOLIN HFA) 108 (90 BASE) MCG/ACT inhaler Inhale into the lungs every 6 (six) hours as needed for wheezing or shortness of breath. (Patient not taking: Reported on 02/12/2023)     No current facility-administered medications for this visit.  Allergies:   Pear   Social History:  The patient  reports that he has never smoked. He has never used smokeless tobacco. He reports current alcohol use. He reports that he does not use drugs.   Family History:   family history includes Asthma in his sister; Diabetes in his father; High blood pressure in his father and mother.    Review of Systems: Review of Systems  Constitutional: Negative.   HENT: Negative.    Respiratory: Negative.    Cardiovascular: Negative.   Gastrointestinal: Negative.   Musculoskeletal: Negative.   Neurological: Negative.   Psychiatric/Behavioral: Negative.     All other systems reviewed and are negative.   PHYSICAL EXAM: VS:  BP 138/68 (BP Location: Left Arm, Patient Position: Sitting, Cuff Size: Large)   Pulse 63   Ht 6\' 5"  (1.956 m)   Wt 278 lb 8 oz (126.3 kg)   SpO2 98%   BMI 33.03 kg/m  , BMI Body mass index is 33.03 kg/m. GEN: Well nourished, well developed, in no acute distress HEENT: normal Neck: no JVD, carotid bruits, or masses Cardiac: RRR; no murmurs, rubs, or gallops,no edema  Respiratory:  clear to auscultation bilaterally, normal work of breathing GI: soft, nontender, nondistended, + BS MS: no deformity or atrophy Skin: warm and dry, no rash Neuro:  Strength and sensation are intact Psych: euthymic mood, full affect   Recent Labs: 02/08/2023: ALT 40; B Natriuretic Peptide 160.0; BUN 17; Creatinine, Ser 1.27; Hemoglobin 15.3; Platelets 307; Potassium 3.6; Sodium 142    Lipid Panel No results found for: "CHOL", "HDL", "LDLCALC", "TRIG"    Wt Readings from Last 3 Encounters:  02/12/23 278 lb 8 oz (126.3 kg)  02/08/23 279 lb 1.6 oz (126.6 kg)  01/06/20 235 lb 14.4 oz (107 kg)      ASSESSMENT AND PLAN:  Problem List Items Addressed This Visit       Cardiology Problems   Paroxysmal atrial fibrillation (HCC) - Primary     Other   Asthma   Paroxysmal atrial fibrillation Records from emergency room requested, reviewed Noted in the setting of bronchitis, evaluated in the emergency room, converting to normal sinus rhythm during evaluation We will order a Zio monitor to determine if having paroxysmal atrial fibrillation and echocardiogram to rule out structural heart disease Recommend he could continue Eliquis 5 twice daily, prescription was provided by the ER though dosing change was made as he received DVT/PE dosing He reports he has ordered a Apple watch for arrhythmia monitoring -Recommend he start metoprolol succinate 12.5 daily  Chest tightness In the setting of asthmatic bronchitis, completing  antibiotics, symptoms have resolved Unable to exclude symptoms from atrial fibrillation, rate in the ER 110 bpm Troponin negative Echocardiogram pending  Asthma Recent bronchitis, treated with antibiotics   Total encounter time more than 60 minutes  Greater than 50% was spent in counseling and coordination of care with the patient    Signed, Dossie Arbour, M.D., Ph.D. [facility] [place], [place] [phone]

## 2023-02-12 NOTE — Patient Instructions (Addendum)
Medication Instructions:  Start taking Metoprolol succinate 12.5 mg daily  Change Eliquis to 5 mg twice a day  If you need a refill on your cardiac medications before your next appointment, please call your pharmacy.   Lab work: No new labs needed  Testing/Procedures: Heart Monitor:  Length of Wear: 14 days  Your monitor will be mailed to your home address within 3-5 business days. However, if you have not received your monitor after 5 business days please send [place] a MyChart message or call the office at [phone], so we may follow up on this for you.   Your physician has recommended that you wear a Zio XT monitor.   This monitor is a medical device that records the heart's electrical activity. Doctors most often use these monitors to diagnose arrhythmias. Arrhythmias are problems with the speed or rhythm of the heartbeat. The monitor is a small device applied to your chest. You can wear one while you do your normal daily activities. While wearing this monitor if you have any symptoms to push the button and record what you felt. Once you have worn this monitor for the period of time provider prescribed (Usually 14 days), you will return the monitor device in the postage paid box. Once it is returned they will download the data collected and provide [place] with a report which the provider will then review and we will call you with those results. Important tips:  Avoid showering during the first 24 hours of wearing the monitor. Avoid excessive sweating to help maximize wear time. Do not submerge the device, no hot tubs, and no swimming pools. Keep any lotions or oils away from the patch. After 24 hours you may shower with the patch on. Take brief showers with your back facing the shower head.  Do not remove patch once it has been placed because that will interrupt data and decrease adhesive wear time. Push the button when you have any symptoms and write down what you were feeling. Once you  have completed wearing your monitor, remove and place into box which has postage paid and place in your outgoing mailbox.  If for some reason you have misplaced your box then call our office and we can provide another box and/or mail it off for you.   Your physician has requested that you have an echocardiogram. Echocardiography is a painless test that uses sound waves to create images of your heart. It provides your doctor with information about the size and shape of your heart and how well your heart's chambers and valves are working.   You may receive an ultrasound enhancing agent through an IV if needed to better visualize your heart during the echo. This procedure takes approximately one hour.  There are no restrictions for this procedure.  This will take place at [address] [address], [place] [address]   Follow-Up: At [facility], you and your health needs are our priority.  As part of our continuing mission to provide you with exceptional heart care, we have created designated Provider Care Teams.  These Care Teams include your primary Cardiologist (physician) and Advanced Practice Providers (APPs -  Physician Assistants and Nurse Practitioners) who all work together to provide you with the care you need, when you need it.  You will need a follow up appointment in 2 months  Providers on your designated Care Team:   Nicolasa Ducking, NP Eula Listen, PA-C Cadence Fransico Michael, [place]  COVID-19 Vaccine Information  can be found at: [email or web address] For questions related to vaccine distribution or appointments, please email [email or web address] or call [phone].

## 2023-02-17 ENCOUNTER — Encounter: Payer: Self-pay | Admitting: Cardiovascular Disease

## 2023-02-26 ENCOUNTER — Ambulatory Visit: Payer: No Typology Code available for payment source | Attending: Cardiovascular Disease

## 2023-02-26 ENCOUNTER — Other Ambulatory Visit: Payer: Self-pay

## 2023-02-26 DIAGNOSIS — I480 Paroxysmal atrial fibrillation: Secondary | ICD-10-CM

## 2023-02-26 NOTE — Progress Notes (Signed)
We have returned from our visit to [facility] (great trip by the way) and I still have not seen my iRhythm.    Could you request another unit be sent to me?     Thanks!

## 2023-03-01 DIAGNOSIS — I480 Paroxysmal atrial fibrillation: Secondary | ICD-10-CM

## 2023-03-26 ENCOUNTER — Emergency Department
Admission: EM | Admit: 2023-03-26 | Discharge: 2023-03-26 | Disposition: A | Discharge status: Home / Self Care | Payer: Medicare Other | Attending: Emergency Medicine | Admitting: Emergency Medicine

## 2023-03-26 ENCOUNTER — Emergency Department: Payer: Medicare Other

## 2023-03-26 ENCOUNTER — Other Ambulatory Visit: Payer: Self-pay

## 2023-03-26 DIAGNOSIS — I10 Essential (primary) hypertension: Secondary | ICD-10-CM | POA: Insufficient documentation

## 2023-03-26 DIAGNOSIS — J45909 Unspecified asthma, uncomplicated: Secondary | ICD-10-CM | POA: Diagnosis not present

## 2023-03-26 DIAGNOSIS — R079 Chest pain, unspecified: Secondary | ICD-10-CM

## 2023-03-26 DIAGNOSIS — I4891 Unspecified atrial fibrillation: Secondary | ICD-10-CM | POA: Diagnosis not present

## 2023-03-26 HISTORY — DX: Unspecified atrial fibrillation: I48.91

## 2023-03-26 LAB — TROPONIN I (HIGH SENSITIVITY)
Troponin I (High Sensitivity): 7 ng/L (ref <18)
Troponin I (High Sensitivity): 8 ng/L (ref <18)

## 2023-03-26 LAB — BASIC METABOLIC PANEL
Anion gap: 9 (ref 5–15)
BUN: 16 mg/dL (ref 8–23)
CO2: 22 mmol/L (ref 22–32)
Calcium: 9.6 mg/dL (ref 8.9–10.3)
Chloride: 107 mmol/L (ref 98–111)
Creatinine, Ser: 0.96 mg/dL (ref 0.61–1.24)
GFR, Estimated: >60 mL/min (ref >60)
Glucose, Bld: 100 mg/dL — ABNORMAL HIGH (ref 70–99)
Potassium: 4 mmol/L (ref 3.5–5.1)
Sodium: 138 mmol/L (ref 135–145)

## 2023-03-26 LAB — CBC
HCT: 47.8 % (ref 39.0–52.0)
Hemoglobin: 15.8 g/dL (ref 13.0–17.0)
MCH: 28.4 pg (ref 26.0–34.0)
MCHC: 33.1 g/dL (ref 30.0–36.0)
MCV: 85.8 fL (ref 80.0–100.0)
Platelets: 300 10*3/uL (ref 150–400)
RBC: 5.57 MIL/uL (ref 4.22–5.81)
RDW: 14 % (ref 11.5–15.5)
WBC: 6.6 10*3/uL (ref 4.0–10.5)
nRBC: 0 % (ref 0.0–0.2)

## 2023-03-26 NOTE — ED Triage Notes (Signed)
Pt to ED POV for 10 second episode of mid chest pressure about 1.5hr ago. No dizziness or SOB at that time. Pt has hx a fib. Respirations unlabored, skin dry. Pt takes eliquis.

## 2023-03-26 NOTE — ED Provider Notes (Signed)
[facility] Provider Note    Event Date/Time   First MD Initiated Contact with Patient 03/26/23 1610     (approximate)   History   Chief Complaint Chest Pain   HPI  Mike Miller. is a 72 y.o. male with past medical history of hypertension, hyperlipidemia, atrial fibrillation on Eliquis, and asthma who presents to the ED complaining of chest pain.  Patient reports that he had sudden onset sharp pain in the center of his chest about 1 hour prior to arrival.  He states that the pain only lasted for 10 seconds before resolving on its own.  He did not have any associated difficulty breathing, has otherwise felt well with no fevers or cough.  He has not noticed any pain or swelling in his legs and denies any palpitations.     Physical Exam   Triage Vital Signs: ED Triage Vitals  Encounter Vitals Group     BP 03/26/23 1517 (!) 157/100     Systolic BP Percentile --      Diastolic BP Percentile --      Pulse Rate 03/26/23 1517 100     Resp 03/26/23 1517 16     Temp 03/26/23 1517 98.6 F (37 C)     Temp Source 03/26/23 1517 Oral     SpO2 03/26/23 1517 100 %     Weight 03/26/23 1515 275 lb (124.7 kg)     Height 03/26/23 1515 6\' 5"  (1.956 m)     Head Circumference --      Peak Flow --      Pain Score 03/26/23 1515 0     Pain Loc --      Pain Education --      Exclude from Growth Chart --     Most recent vital signs: Vitals:   03/26/23 1517  BP: (!) 157/100  Pulse: 100  Resp: 16  Temp: 98.6 F (37 C)  SpO2: 100%    Constitutional: Alert and oriented. Eyes: Conjunctivae are normal. Head: Atraumatic. Nose: No congestion/rhinnorhea. Mouth/Throat: Mucous membranes are moist.  Cardiovascular: Normal rate, irregularly irregular rhythm. Grossly normal heart sounds.  2+ radial pulses bilaterally. Respiratory: Normal respiratory effort.  No retractions. Lungs CTAB. Gastrointestinal: Soft and nontender. No distention. Musculoskeletal: No lower  extremity tenderness nor edema.  Neurologic:  Normal speech and language. No gross focal neurologic deficits are appreciated.    ED Results / Procedures / Treatments   Labs (all labs ordered are listed, but only abnormal results are displayed) Labs Reviewed  BASIC METABOLIC PANEL - Abnormal; Notable for the following components:      Result Value   Glucose, Bld 100 (*)    All other components within normal limits  CBC  TROPONIN I (HIGH SENSITIVITY)  TROPONIN I (HIGH SENSITIVITY)     EKG  ED ECG REPORT I, Chesley Noon, the attending physician, personally viewed and interpreted this ECG.   Date: 03/26/2023  EKG Time: 15:14  Rate: 100  Rhythm: atrial fibrillation  Axis: Normal  Intervals:none  ST&T Change: Nonspecific ST/T wave abnormality  RADIOLOGY Chest x-ray reviewed and interpreted by me with no infiltrate, edema, or effusion.  PROCEDURES:  Critical Care performed: No  Procedures   MEDICATIONS ORDERED IN ED: Medications - No data to display   IMPRESSION / MDM / ASSESSMENT AND PLAN / ED COURSE  I reviewed the triage vital signs and the nursing notes.  72 y.o. male with past medical history of hypertension, hyperlipidemia, atrial fibrillation on Eliquis, and asthma who presents to the ED complaining of episode of sharp chest pain earlier today that lasted for 10 seconds before resolving.  Patient's presentation is most consistent with acute presentation with potential threat to life or bodily function.  Differential diagnosis includes, but is not limited to, ACS, PE, dissection, arrhythmia, musculoskeletal pain, GERD, anxiety, anemia, electrolyte abnormality.  Patient nontoxic-appearing and in no acute distress, vital signs are unremarkable.  EKG shows atrial fibrillation with controlled rate and nonspecific ST/T abnormality.  Symptoms seem very atypical for ACS and initial troponin within normal limits, will observe on cardiac  monitor and repeat troponin.  Remainder of labs are unremarkable with no significant anemia, leukocytosis, tract abnormality, or AKI.  Chest x-ray is also unremarkable.  I doubt PE or dissection given resolution of his symptoms with reassuring vital signs.  Repeat troponin within normal limits, patient remained symptom-free here in the ED.  He does remain in atrial fibrillation, however rate is controlled in the 70s and 80s.  Patient appropriate for outpatient management and follow-up with cardiology.  He was counseled to return to the ED for new or worsening symptoms, patient agrees with plan.      FINAL CLINICAL IMPRESSION(S) / ED DIAGNOSES   Final diagnoses:  Nonspecific chest pain  Atrial fibrillation, unspecified type [facility])     Rx / DC Orders   ED Discharge Orders          Ordered    Ambulatory referral to Cardiology        03/26/23 1838             Note:  This document was prepared using Dragon voice recognition software and may include unintentional dictation errors.   Chesley Noon, MD 03/26/23 [phone]

## 2023-04-08 ENCOUNTER — Telehealth: Payer: Self-pay | Admitting: Emergency Medicine

## 2023-04-08 DIAGNOSIS — I480 Paroxysmal atrial fibrillation: Secondary | ICD-10-CM

## 2023-04-08 MED ORDER — METOPROLOL SUCCINATE ER 25 MG PO TB24: 25.0000 mg | ORAL_TABLET | Freq: Every day | ORAL | 3 refills | Status: DC

## 2023-04-08 NOTE — Telephone Encounter (Signed)
Called and spoke with patient. Informed him of the following from Dr. Mariah Milling. Event monitor  Normal rhythm noted  But 25% of the time was in atrial fibrillation  Would recommend increasing metoprolol succinate up to 25 daily  Stay on Eliquis 5 twice a day  Waiting on echocardiogram  Would also suggest sleep study, we can refer to pulmonary , or order  the Watch Pat device  Appears he has appointment with EP August 12   Patient verbalizes understanding. Prescription sent to preferred pharmacy. Consult placed for pulmonary.

## 2023-04-11 ENCOUNTER — Ambulatory Visit: Payer: Medicare Other | Attending: Cardiovascular Disease

## 2023-04-11 DIAGNOSIS — I480 Paroxysmal atrial fibrillation: Secondary | ICD-10-CM

## 2023-04-11 LAB — ECHOCARDIOGRAM COMPLETE
AR max vel: 3.54 cm2
AV Area VTI: 3.41 cm2
AV Area mean vel: 3.34 cm2
AV Mean grad: 2.3 mmHg
AV Peak grad: 4.8 mmHg
Ao pk vel: 1.09 m/s
Calc EF: 49.8 %
S' Lateral: 3.5 cm
Single Plane A2C EF: 51.8 %
Single Plane A4C EF: 49.8 %

## 2023-04-22 ENCOUNTER — Ambulatory Visit: Payer: Medicare Other | Admitting: Internal Medicine

## 2023-04-24 ENCOUNTER — Encounter: Payer: Self-pay | Admitting: Cardiology

## 2023-04-24 ENCOUNTER — Ambulatory Visit: Payer: Medicare Other | Attending: Internal Medicine | Admitting: Cardiology

## 2023-04-24 VITALS — BP 140/70 | HR 62 | Ht 77.0 in | Wt 281.0 lb

## 2023-04-24 DIAGNOSIS — D6869 Other thrombophilia: Secondary | ICD-10-CM | POA: Diagnosis not present

## 2023-04-24 DIAGNOSIS — I480 Paroxysmal atrial fibrillation: Secondary | ICD-10-CM | POA: Diagnosis not present

## 2023-04-24 DIAGNOSIS — I10 Essential (primary) hypertension: Secondary | ICD-10-CM | POA: Diagnosis not present

## 2023-04-24 MED ORDER — METOPROLOL TARTRATE 25 MG PO TABS: ORAL_TABLET | ORAL | 2 refills | Status: DC

## 2023-04-24 NOTE — Progress Notes (Signed)
Cardiology Office Note Date:  04/24/2023  Patient ID:  Mike Swalve., DOB 1950-11-08, MRN [id number] PCP:  Lynnea Ferrier, MD  Cardiologist:  Julien Nordmann, MD Electrophysiologist: None    Chief Complaint: afib  History of Present Illness: Mike Miller. is a 72 y.o. male with PMH notable for parox AFib, asthma, T2DM, HTN; seen today for routine afib followup.  He was first diagnosed with afib 01/2023 after presenting to ER. He was started on eliquis at that visit. He saw Dr. Mariah Milling in follow-up, was doing well and back in NSR. Ambulatory monitored to further eval afib burden, revealed a 25% burden. Toprol increased to 25mg  daily and recommended to see pulm for OSA eval. Pulm eval scheduled early September   On follow-up today, he continues to have paroxysms of afib. This past weekend was in AFib for 3 days. During episode, was significantly fatigued. HR during episode was 100-120 via BP machine. No chest pain, chest pressure, no palpitations.  He diligently takes eliquis BID, no missed doses, no bleeding concerns. He checks his BP daily, most readings 130-140 systolics, rarely 110s. He rarely drinks etoh, 1-2 drinks per month. No tobacco use.  Currently, he denies chest pain, palpitations, dyspnea, PND, orthopnea, nausea, vomiting, dizziness, syncope, edema, weight gain, or early satiety.    AAD History: none  Past Medical History:  Diagnosis Date   Asthma    Atrial fibrillation (HCC)    Hyperlipidemia    Hypertension     Past Surgical History:  Procedure Laterality Date   COLONOSCOPY WITH PROPOFOL N/A 05/30/2015   Procedure: COLONOSCOPY WITH PROPOFOL;  Surgeon: Scot Jun, MD;  Location: [facility] ENDOSCOPY;  Service: Endoscopy;  Laterality: N/A;    Current Outpatient Medications  Medication Instructions   albuterol (PROVENTIL HFA;VENTOLIN HFA) 108 (90 BASE) MCG/ACT inhaler Every 6 hours PRN   apixaban (ELIQUIS) 5 mg, Oral, 2 times daily   atorvastatin  (LIPITOR) 20 mg, Oral, Daily   azelastine (ASTELIN) 0.1 % nasal spray 1 spray, Each Nare, Daily, Use in each nostril as directed    brimonidine (ALPHAGAN P) 0.1 % SOLN 1 drop, Right Eye, 2 times daily   fluticasone (FLONASE) 50 MCG/ACT nasal spray 1 spray, Each Nare, Daily   Fluticasone-Salmeterol (ADVAIR) 250-50 MCG/DOSE AEPB 1 puff, Inhalation, 2 times daily   metoprolol succinate (TOPROL XL) 25 mg, Oral, Daily   pseudoephedrine-guaifenesin (MUCINEX D) 60-600 MG 12 hr tablet 1 tablet, Oral, Every 12 hours, X 7 days   telmisartan (MICARDIS) 20 mg, Oral, Daily    Social History:  The patient  reports that he has never smoked. He has never used smokeless tobacco. He reports current alcohol use. He reports that he does not use drugs.   Family History: The patient's family history includes Asthma in his sister; Diabetes in his father; High blood pressure in his father and mother.  ROS:  Please see the history of present illness. All other systems are reviewed and otherwise negative.   PHYSICAL EXAM:  VS:  BP (!) 140/70 (BP Location: Left Arm, Patient Position: Sitting, Cuff Size: Normal)   Pulse 62   Ht 6\' 5"  (1.956 m)   Wt 281 lb (127.5 kg)   BMI 33.32 kg/m  BMI: Body mass index is 33.32 kg/m.  GEN- The patient is well appearing, alert and oriented x 3 today.  Appears younger than stated age Lungs- Clear to ausculation bilaterally, normal work of breathing.  Heart- Regular rate and rhythm, no  murmurs, rubs or gallops Extremities- No peripheral edema, warm, dry   EKG is ordered. Personal review of EKG from today shows:     EKG Interpretation Date/Time:  Wednesday April 24 2023 14:18:33 EDT Ventricular Rate:  62 PR Interval:  290 QRS Duration:  96 QT Interval:  414 QTC Calculation: 420 R Axis:   -4  Text Interpretation: Sinus rhythm with 1st degree A-V block with Premature atrial complexes Confirmed by Sherie Don [phone]) on 04/24/2023 2:42:07 PM    Recent Labs: 02/08/2023:  ALT 40; B Natriuretic Peptide 160.0 03/26/2023: BUN 16; Creatinine, Ser 0.96; Hemoglobin 15.8; Platelets 300; Potassium 4.0; Sodium 138  No results found for requested labs within last 365 days.   CrCl cannot be calculated (Patient's most recent lab result is older than the maximum 21 days allowed.).   Wt Readings from Last 3 Encounters:  03/26/23 275 lb (124.7 kg)  02/12/23 278 lb 8 oz (126.3 kg)  02/08/23 279 lb 1.6 oz (126.6 kg)     Additional studies reviewed include: Previous EP, cardiology notes.   TTE, 04/11/2023  1. Left ventricular ejection fraction, by estimation, is 60 to 65%. Left ventricular ejection fraction by PLAX is 59 %. The left ventricle has normal function. The left ventricle has no regional wall motion abnormalities. Left ventricular diastolic parameters are indeterminate.   2. Right ventricular systolic function is normal. The right ventricular size is normal.   3. Left atrial size was moderately dilated.   4. The mitral valve is normal in structure. Mild mitral valve regurgitation. No evidence of mitral stenosis.   5. The aortic valve is normal in structure. Aortic valve regurgitation is not visualized. No aortic stenosis is present.   6. There is mild dilatation of the aortic root, measuring 40 mm.   7. The inferior vena cava is normal in size with greater than 50% respiratory variability, suggesting right atrial pressure of 3 mmHg.   Long term monitor, 04/02/2023 Monitor 1 Normal sinus rhythm Patient had a min HR of 43 bpm, max HR of 148 bpm, and avg HR of 67 bpm.  11 Supraventricular Tachycardia runs occurred, the run with the fastest interval lasting 6 beats with a max rate of 148 bpm, the longest lasting 9.2 secs with an avg rate of 115 bpm.   Isolated SVEs were rare (<1.0%), SVE Couplets were rare (<1.0%), and SVE Triplets were rare (<1.0%).  Isolated VEs were rare (<1.0%), VE Couplets were rare (<1.0%), and no VE Triplets were present.  Triggered events (2)  were associated with normal sinus rhythm   Monitor 2 Normal sinus rhythm Patient had a min HR of 29 bpm, max HR of 210 bpm, and avg HR of 69 bpm.   1 run of Ventricular Tachycardia occurred lasting 7 beats with a max rate of 210 bpm (avg 197 bpm).   31 Supraventricular Tachycardia/atrial tachycardia runs occurred, the run with the fastest interval lasting 13 beats with a max rate of 150 bpm, the longest lasting 19 beats with an avg rate of 101 bpm. Some episodes of Supraventricular Tachycardia may be possible    Atrial Fibrillation occurred (25% burden), ranging from 30-204 bpm (avg of 89 bpm), the longest lasting 9 hours 39 mins with an avg rate of 106 bpm.    37 Pauses occurred, the longest lasting 4.4 secs (14 bpm).  Most pauses presenting while in atrial fibrillation overnight 2 AM to 5 AM Second Degree AV Block-Mobitz I (Wenckebach) was present.  Junctional Rhythm was  present.   Isolated SVEs were rare (<1.0%), SVE Couplets were rare(<1.0%), and SVE Triplets were rare (<1.0%).  Isolated VEs were rare (<1.0%, 317), VE Couplets were rare (<1.0%, 1), and VE Triplets were rare (<1.0%, 1).  Patient triggered events associated with normal sinus rhythm and atrial fibrillation  ASSESSMENT AND PLAN:  #) parox afib Continues to have paroxysms of afib on toprol 25mg  nightly Continue toprol  Add lopressor 25mg  PRN for tachycardia during afib episodes Discussed medication and procedural AF treatments.  Patient is not interested in ablation at this time, but may be in future Medications, we discussed tikosyn vs multaq. Do not favor flecainide or propafenone d/t 1st deg HB.  He is not interested in [place] d/t requiring hospitalization for loading He requested time and information to research multaq, which I think is reasonable.  He has pulm appt soon for OSA eval, recommended to attend appt  #) Hypercoag d/t parox afib CHA2DS2-VASc Score = 3 [CHF History: 0, HTN History: 1, Diabetes History: 1,  Stroke History: 0, Vascular Disease History: 0, Age Score: 1, Gender Score: 0].  Therefore, the patient's annual risk of stroke is 3.2 %.  Stroke ppx - 5mg  eliquis BID, appropriately dosed No bleeding concerns  #) HTN Elevated in office Patient states his BP is better controlled when he is able to exercise regularly. Instead of medication adjustments, he preferred to make lifestyle changes which I think is reasonable      Current medicines are reviewed at length with the patient today.   The patient does not have concerns regarding his medicines.  The following changes were made today:  none  Labs/ tests ordered today include:  Orders Placed This Encounter  Procedures   EKG 12-Lead   EKG 12-Lead     Disposition: Follow up with EP APP  6 weeks    Signed, Sherie Don, NP  04/24/23  8:46 AM  Electrophysiology [facility]

## 2023-04-24 NOTE — Patient Instructions (Signed)
Medication Instructions:   Your physician has recommended you make the following change in your medication:   START Metoprolol Tartrate 25 mg tablet by mouth twice daily as needed for Tachycardia (rapid heart beat greater than >100 bpm).  *If you need a refill on your cardiac medications before your next appointment, please call your pharmacy*   Lab Work:  No lab work ordered today.  If you have labs (blood work) drawn today and your tests are completely normal, you will receive your results only by: MyChart Message (if you have MyChart) OR A paper copy in the mail If you have any lab test that is abnormal or we need to change your treatment, we will call you to review the results.   Testing/Procedures:  No testing ordered today.   Follow-Up: At [facility], you and your health needs are our priority.  As part of our continuing mission to provide you with exceptional heart care, we have created designated Provider Care Teams.  These Care Teams include your primary Cardiologist (physician) and Advanced Practice Providers (APPs -  Physician Assistants and Nurse Practitioners) who all work together to provide you with the care you need, when you need it.  We recommend signing up for the patient portal called "MyChart".  Sign up information is provided on this After Visit Summary.  MyChart is used to connect with patients for Virtual Visits (Telemedicine).  Patients are able to view lab/test results, encounter notes, upcoming appointments, etc.  Non-urgent messages can be sent to your provider as well.   To learn more about what you can do with MyChart, go to [email or web address].    Your next appointment:   6 week(s)  Provider:   Sherie Don, NP

## 2023-05-15 ENCOUNTER — Ambulatory Visit (INDEPENDENT_AMBULATORY_CARE_PROVIDER_SITE_OTHER): Payer: Medicare Other | Admitting: Primary Care

## 2023-05-15 ENCOUNTER — Encounter: Payer: Self-pay | Admitting: Primary Care

## 2023-05-15 VITALS — BP 132/70 | HR 96 | Temp 98.1°F | Ht 77.0 in | Wt 279.8 lb

## 2023-05-15 DIAGNOSIS — R0683 Snoring: Secondary | ICD-10-CM | POA: Diagnosis not present

## 2023-05-15 DIAGNOSIS — I480 Paroxysmal atrial fibrillation: Secondary | ICD-10-CM | POA: Diagnosis not present

## 2023-05-15 NOTE — Assessment & Plan Note (Addendum)
-   Patient has symptoms of loud snoring and restless sleep.  Associated daytime sleepiness.  Epworth score 7.  BMI 33.  He does have a history of paroxysmal A-fib and asthma.  Concern patient could have underlying obstructive sleep apnea, recommending patient have in lab split-night sleep study.  We reviewed risks of untreated sleep apnea including cardiac arrhythmia, pulmonary hypertension, diabetes and stroke.  We also discussed treatment options including weight loss, oral appliance, CPAP therapy or referral to ENT for possible surgical options.  Encouraged weight loss efforts, side sleeping position and advised against driving if experiencing excessive daytime sleepiness or fatigue.

## 2023-05-15 NOTE — Progress Notes (Signed)
Reviewed and agree with assessment/plan.   Coralyn Helling, MD [facility] Pulmonary/Critical Care 05/15/2023, 3:22 PM Pager:  [phone]

## 2023-05-15 NOTE — Progress Notes (Signed)
@Patient  ID: Mike Miller., male    DOB: Jan 31, 1951, 72 y.o.   MRN: [id number]  Chief Complaint  Patient presents with   sleep consult    Sleep study >10y ago.  Loud snoring, restless sleep and occ daytime sleepiness.     Referring provider: Antonieta Iba, MD  HPI: 72 year old male, never smoked.  Past medical history significant for hypertension, paroxysmal A-fib, asthma, type 2 diabetes, hyperlipidemia.    05/15/2023 Patient presents today for sleep consult. He had sleep study > 10 years ago which was negative. His weight is up 40 lbs. He continues to have occasional snoring symptoms. He has woken himself up once before gasping or air. Sleep can be restless at times. He experiences some daytime sleepiness, he will at times take 20-25 min nap. Typical bedtime is between 1030 and 11:30 PM.  It can take 20 to 25 minutes to fall asleep.  He wakes up between 2-3 times a night.  He starts his day at 5:30 AM. He is primarily retired, works part times. Last episode of afib was month ago.  Epworth score 7. No concern for narcolepsy, cataplexy or sleepwalking.  Social history: Patient is married, lives with his spouse.  He is retired.  He does not have children.   No tobacco hx. He drinks alcohol 2-3 times a month.  Allergies  Allergen Reactions   Pear Other (See Comments)    Other reaction(s): Unknown    Immunization History  Administered Date(s) Administered   Fluad Quad(high Dose 65+) 06/13/2022   Influenza Inj Mdck Quad Pf 06/01/2021   Influenza,inj,Quad PF,6+ Mos 05/27/2020   MMR 04/18/1994   Moderna Covid-19 Vaccine Bivalent Booster 51yrs & up 03/10/2021, 07/12/2021   Moderna Sars-Covid-2 Vaccination 11/02/2019, 12/01/2019, 08/01/2020   Tdap 09/10/2014   Zoster Recombinant(Shingrix) 12/13/2021    Past Medical History:  Diagnosis Date   Asthma    Atrial fibrillation (HCC)    Hyperlipidemia    Hypertension     Tobacco History: Social History   Tobacco Use   Smoking Status Never  Smokeless Tobacco Never   Counseling given: Not Answered   Outpatient Medications Prior to Visit  Medication Sig Dispense Refill   albuterol (PROVENTIL HFA;VENTOLIN HFA) 108 (90 BASE) MCG/ACT inhaler Inhale into the lungs every 6 (six) hours as needed for wheezing or shortness of breath.     apixaban (ELIQUIS) 5 MG TABS tablet Take 1 tablet (5 mg total) by mouth 2 (two) times daily. 60 tablet 6   atorvastatin (LIPITOR) 20 MG tablet Take 20 mg by mouth daily.     azelastine (ASTELIN) 0.1 % nasal spray Place 1 spray into both nostrils daily. Use in each nostril as directed     brimonidine (ALPHAGAN P) 0.1 % SOLN Place 1 drop into the right eye in the morning and at bedtime.     fluticasone (FLONASE) 50 MCG/ACT nasal spray Place 1 spray into both nostrils daily.     Fluticasone-Salmeterol (ADVAIR) 250-50 MCG/DOSE AEPB Inhale 1 puff into the lungs 2 (two) times daily.     metoprolol succinate (TOPROL XL) 25 MG 24 hr tablet Take 1 tablet (25 mg total) by mouth daily. 90 tablet 3   metoprolol tartrate (LOPRESSOR) 25 MG tablet Take 1 tablet by mouth twice daily as needed for Tachycardia (Rapid heart beat >100). 60 tablet 2   pseudoephedrine-guaifenesin (MUCINEX D) 60-600 MG 12 hr tablet Take 1 tablet by mouth every 12 (twelve) hours. X 7 days  telmisartan (MICARDIS) 20 MG tablet Take 20 mg by mouth daily.     No facility-administered medications prior to visit.   Review of Systems  Review of Systems  Constitutional:  Positive for fatigue.  HENT: Negative.    Respiratory: Negative.    Cardiovascular: Negative.   Neurological: Negative.   Psychiatric/Behavioral:  Positive for sleep disturbance.     Physical Exam  BP 132/70 (BP Location: Right Arm, Cuff Size: Normal)   Pulse 96   Temp 98.1 F (36.7 C) (Temporal)   Ht 6\' 5"  (1.956 m)   Wt 279 lb 12.8 oz (126.9 kg)   SpO2 99%   BMI 33.18 kg/m  Physical Exam Constitutional:      General: He is not in acute  distress.    Appearance: Normal appearance. He is not ill-appearing.  HENT:     Head: Normocephalic and atraumatic.     Mouth/Throat:     Mouth: Mucous membranes are moist.     Pharynx: Oropharynx is clear.  Cardiovascular:     Rate and Rhythm: Normal rate and regular rhythm.  Pulmonary:     Effort: Pulmonary effort is normal.     Breath sounds: Normal breath sounds.  Musculoskeletal:        General: Normal range of motion.  Skin:    General: Skin is warm and dry.  Neurological:     General: No focal deficit present.     Mental Status: He is alert and oriented to person, place, and time. Mental status is at baseline.  Psychiatric:        Mood and Affect: Mood normal.        Behavior: Behavior normal.        Thought Content: Thought content normal.        Judgment: Judgment normal.      Lab Results:  CBC    Component Value Date/Time   WBC 6.6 03/26/2023 1516   RBC 5.57 03/26/2023 1516   HGB 15.8 03/26/2023 1516   HCT 47.8 03/26/2023 1516   PLT 300 03/26/2023 1516   MCV 85.8 03/26/2023 1516   MCH 28.4 03/26/2023 1516   MCHC 33.1 03/26/2023 1516   RDW 14.0 03/26/2023 1516   LYMPHSABS 1.7 02/08/2023 1849   MONOABS 1.1 (H) 02/08/2023 1849   EOSABS 0.3 02/08/2023 1849   BASOSABS 0.1 02/08/2023 1849    BMET    Component Value Date/Time   NA 138 03/26/2023 1516   K 4.0 03/26/2023 1516   K 4.1 05/13/2012 1037   CL 107 03/26/2023 1516   CO2 22 03/26/2023 1516   GLUCOSE 100 (H) 03/26/2023 1516   BUN 16 03/26/2023 1516   CREATININE 0.96 03/26/2023 1516   CALCIUM 9.6 03/26/2023 1516   GFRNONAA >60 03/26/2023 1516    BNP    Component Value Date/Time   BNP 160.0 (H) 02/08/2023 1849    ProBNP No results found for: "PROBNP"  Imaging: No results found.   Assessment & Plan:   Loud snoring - Patient has symptoms of loud snoring and restless sleep.  Associated daytime sleepiness.  Epworth score 7.  BMI 33.  He does have a history of paroxysmal A-fib and  asthma.  Concern patient could have underlying obstructive sleep apnea, recommending patient have in lab split-night sleep study.  We reviewed risks of untreated sleep apnea including cardiac arrhythmia, pulmonary hypertension, diabetes and stroke.  We also discussed treatment options including weight loss, oral appliance, CPAP therapy or referral to ENT for possible  surgical options.  Encouraged weight loss efforts, side sleeping position and advised against driving if experiencing excessive daytime sleepiness or fatigue.   Glenford Bayley, NP 05/15/2023

## 2023-05-15 NOTE — Patient Instructions (Signed)
Recommendations Focus on side sleeping position or elevate head of bed 30 degrees with wedge pillow if back sleeper Do not drive if experiencing excessive daytime sleepiness fatigue.  Orders: Split night sleep study re: snoring, afib   Follow-up Please call 1 to 2 weeks after completing sleep study for results  Sleep Apnea Sleep apnea affects breathing during sleep. It causes breathing to stop for 10 seconds or more, or to become shallow. People with sleep apnea usually snore loudly. It can also increase the risk of: Heart attack. Stroke. Being very overweight (obese). Diabetes. Heart failure. Irregular heartbeat. High blood pressure. The goal of treatment is to help you breathe normally again. What are the causes?  The most common cause of this condition is a collapsed or blocked airway. There are three kinds of sleep apnea: Obstructive sleep apnea. This is caused by a blocked or collapsed airway. Central sleep apnea. This happens when the brain does not send the right signals to the muscles that control breathing. Mixed sleep apnea. This is a combination of obstructive and central sleep apnea. What increases the risk? Being overweight. Smoking. Having a small airway. Being older. Being male. Drinking alcohol. Taking medicines to calm yourself (sedatives or tranquilizers). Having family members with the condition. Having a tongue or tonsils that are larger than normal. What are the signs or symptoms? Trouble staying asleep. Loud snoring. Headaches in the morning. Waking up gasping. Dry mouth or sore throat in the morning. Being sleepy or tired during the day. If you are sleepy or tired during the day, you may also: Not be able to focus your mind (concentrate). Forget things. Get angry a lot and have mood swings. Feel sad (depressed). Have changes in your personality. Have less interest in sex, if you are male. Be unable to have an erection, if you are male. How  is this treated?  Sleeping on your side. Using a medicine to get rid of mucus in your nose (decongestant). Avoiding the use of alcohol, medicines to help you relax, or certain pain medicines (narcotics). Losing weight, if needed. Changing your diet. Quitting smoking. Using a machine to open your airway while you sleep, such as: An oral appliance. This is a mouthpiece that shifts your lower jaw forward. A CPAP device. This device blows air through a mask when you breathe out (exhale). An EPAP device. This has valves that you put in each nostril. A BIPAP device. This device blows air through a mask when you breathe in (inhale) and breathe out. Having surgery if other treatments do not work. Follow these instructions at home: Lifestyle Make changes that your doctor recommends. Eat a healthy diet. Lose weight if needed. Avoid alcohol, medicines to help you relax, and some pain medicines. Do not smoke or use any products that contain nicotine or tobacco. If you need help quitting, ask your doctor. General instructions Take over-the-counter and prescription medicines only as told by your doctor. If you were given a machine to use while you sleep, use it only as told by your doctor. If you are having surgery, make sure to tell your doctor you have sleep apnea. You may need to bring your device with you. Keep all follow-up visits. Contact a doctor if: The machine that you were given to use during sleep bothers you or does not seem to be working. You do not get better. You get worse. Get help right away if: Your chest hurts. You have trouble breathing in enough air. You have an  uncomfortable feeling in your back, arms, or stomach. You have trouble talking. One side of your body feels weak. A part of your face is hanging down. These symptoms may be an emergency. Get help right away. Call your local emergency services (911 in the [place] Do not wait to see if the symptoms will go away. Do  not drive yourself to the hospital. Summary This condition affects breathing during sleep. The most common cause is a collapsed or blocked airway. The goal of treatment is to help you breathe normally while you sleep. This information is not intended to replace advice given to you by your health care provider. Make sure you discuss any questions you have with your health care provider. Document Revised: 04/05/2021 Document Reviewed: 08/05/2020 Elsevier Patient Education  2024 [employer].

## 2023-05-20 ENCOUNTER — Ambulatory Visit: Payer: Medicare Other | Attending: Otolaryngology

## 2023-05-20 DIAGNOSIS — G4733 Obstructive sleep apnea (adult) (pediatric): Secondary | ICD-10-CM | POA: Insufficient documentation

## 2023-05-20 DIAGNOSIS — I4891 Unspecified atrial fibrillation: Secondary | ICD-10-CM | POA: Insufficient documentation

## 2023-05-20 DIAGNOSIS — E119 Type 2 diabetes mellitus without complications: Secondary | ICD-10-CM | POA: Insufficient documentation

## 2023-05-20 DIAGNOSIS — E669 Obesity, unspecified: Secondary | ICD-10-CM | POA: Diagnosis not present

## 2023-05-20 DIAGNOSIS — R0683 Snoring: Secondary | ICD-10-CM | POA: Insufficient documentation

## 2023-06-03 ENCOUNTER — Telehealth (HOSPITAL_BASED_OUTPATIENT_CLINIC_OR_DEPARTMENT_OTHER): Payer: Medicare Other | Admitting: Pulmonary Disease

## 2023-06-03 DIAGNOSIS — G4733 Obstructive sleep apnea (adult) (pediatric): Secondary | ICD-10-CM

## 2023-06-03 NOTE — Telephone Encounter (Signed)
NPSG  showed  mild  OSA with AHI 10/h with low sat of 82%/ hr  AutoCPAP maybe trialled

## 2023-06-04 NOTE — Telephone Encounter (Signed)
Sleep study showed mild OSA. Please patient virtual visit to review sleep study results. Ok to double book if not already

## 2023-06-06 NOTE — Telephone Encounter (Signed)
Please call patient let him know NPSG showed mild OSA, AHI 10/hour with low spo2 low 82%

## 2023-06-09 NOTE — Progress Notes (Unsigned)
Cardiology Office Note Date:  06/10/2023  Patient ID:  Mike Peper., DOB June 08, 1951, MRN [id number] PCP:  Mike Ferrier, MD  Cardiologist:  Mike Nordmann, MD Electrophysiologist: None    Chief Complaint: afib  History of Present Illness: Mike Gassner. is a 72 y.o. male with PMH notable for parox AFib, asthma, T2DM, HTN; seen today for routine afib followup.  He was first diagnosed with afib 01/2023 after presenting to ER. He was started on eliquis at that visit. He saw Dr. Mariah Miller in follow-up, was doing well and back in NSR. Ambulatory monitored to further eval afib burden, revealed a 25% burden. Toprol increased to 25mg  daily and recommended to see pulm for OSA eval, who he has seen and sleep study showed mild OSA.  I saw him 6 weeks ago where he continued to have afib episodes, main symptom was fatigue. We discussed AAD meds, specifically tikosyn and multaq.   On follow-up today, he continues to have AFib episodes, main symptom continues to be fatigue with some chest discomfort during episodes. He has run out of toprol, continues to take lopressor PRN - has taken 4 times total since last being seen.   He diligently takes eliquis BID, no missed doses, no bleeding concerns.    he denies chest pain, palpitations, dyspnea, PND, orthopnea, nausea, vomiting, dizziness, syncope, edema, weight gain, or early satiety.    AAD History: none  Past Medical History:  Diagnosis Date   Asthma    Atrial fibrillation (HCC)    Hyperlipidemia    Hypertension     Past Surgical History:  Procedure Laterality Date   COLONOSCOPY WITH PROPOFOL N/A 05/30/2015   Procedure: COLONOSCOPY WITH PROPOFOL;  Surgeon: Scot Jun, MD;  Location: [facility] ENDOSCOPY;  Service: Endoscopy;  Laterality: N/A;    Current Outpatient Medications  Medication Instructions   albuterol (PROVENTIL HFA;VENTOLIN HFA) 108 (90 BASE) MCG/ACT inhaler Inhalation, Every 6 hours PRN   apixaban (ELIQUIS) 5 mg, Oral,  2 times daily   atorvastatin (LIPITOR) 20 mg, Oral, Daily   azelastine (ASTELIN) 0.1 % nasal spray 1 spray, Each Nare, Daily, Use in each nostril as directed    brimonidine (ALPHAGAN P) 0.1 % SOLN 1 drop, Right Eye, 2 times daily   fluticasone (FLONASE) 50 MCG/ACT nasal spray 1 spray, Each Nare, Daily   Fluticasone-Salmeterol (ADVAIR) 250-50 MCG/DOSE AEPB 1 puff, Inhalation, 2 times daily   metoprolol succinate (TOPROL XL) 25 mg, Oral, Daily   metoprolol tartrate (LOPRESSOR) 25 MG tablet Take 1 tablet by mouth twice daily as needed for Tachycardia (Rapid heart beat >100).   telmisartan (MICARDIS) 20 mg, Oral, Daily    Social History:  The patient  reports that he has never smoked. He has never used smokeless tobacco. He reports current alcohol use. He reports that he does not use drugs.   Family History: The patient's family history includes Asthma in his sister; Diabetes in his father; High blood pressure in his father and mother.  ROS:  Please see the history of present illness. All other systems are reviewed and otherwise negative.   PHYSICAL EXAM:  VS:  BP (!) 140/78   Pulse 83   Ht 6\' 5"  (1.956 m)   Wt 279 lb (126.6 kg)   SpO2 99%   BMI 33.08 kg/m  BMI: Body mass index is 33.08 kg/m.  GEN- The patient is well appearing, alert and oriented x 3 today.  Appears younger than stated age Lungs- Clear to  ausculation bilaterally, normal work of breathing.  Heart- Irregularly irregular rate and rhythm, no murmurs, rubs or gallops Extremities- No peripheral edema, warm, dry   EKG is ordered. Personal review of EKG from today shows:    EKG Interpretation Date/Time:  Monday June 10 2023 14:13:51 EDT Ventricular Rate:  83 PR Interval:    QRS Duration:  88 QT Interval:  388 QTC Calculation: 455 R Axis:   -12  Text Interpretation: Atrial fibrillation Nonspecific ST abnormality Confirmed by Mike Miller [phone]) on 06/10/2023 2:18:31 PM    Recent Labs: 02/08/2023: ALT 40; B  Natriuretic Peptide 160.0 03/26/2023: BUN 16; Creatinine, Ser 0.96; Hemoglobin 15.8; Platelets 300; Potassium 4.0; Sodium 138  No results found for requested labs within last 365 days.   CrCl cannot be calculated (Patient's most recent lab result is older than the maximum 21 days allowed.).   Wt Readings from Last 3 Encounters:  06/10/23 279 lb (126.6 kg)  05/15/23 279 lb 12.8 oz (126.9 kg)  04/24/23 281 lb (127.5 kg)     Additional studies reviewed include: Previous EP, cardiology notes.   TTE, 04/11/2023  1. Left ventricular ejection fraction, by estimation, is 60 to 65%. Left ventricular ejection fraction by PLAX is 59 %. The left ventricle has normal function. The left ventricle has no regional wall motion abnormalities. Left ventricular diastolic parameters are indeterminate.   2. Right ventricular systolic function is normal. The right ventricular size is normal.   3. Left atrial size was moderately dilated.   4. The mitral valve is normal in structure. Mild mitral valve regurgitation. No evidence of mitral stenosis.   5. The aortic valve is normal in structure. Aortic valve regurgitation is not visualized. No aortic stenosis is present.   6. There is mild dilatation of the aortic root, measuring 40 mm.   7. The inferior vena cava is normal in size with greater than 50% respiratory variability, suggesting right atrial pressure of 3 mmHg.   Long term monitor, 04/02/2023 Monitor 1 Normal sinus rhythm Patient had a min HR of 43 bpm, max HR of 148 bpm, and avg HR of 67 bpm.  11 Supraventricular Tachycardia runs occurred, the run with the fastest interval lasting 6 beats with a max rate of 148 bpm, the longest lasting 9.2 secs with an avg rate of 115 bpm.   Isolated SVEs were rare (<1.0%), SVE Couplets were rare (<1.0%), and SVE Triplets were rare (<1.0%).  Isolated VEs were rare (<1.0%), VE Couplets were rare (<1.0%), and no VE Triplets were present.  Triggered events (2) were associated  with normal sinus rhythm   Monitor 2 Normal sinus rhythm Patient had a min HR of 29 bpm, max HR of 210 bpm, and avg HR of 69 bpm.   1 run of Ventricular Tachycardia occurred lasting 7 beats with a max rate of 210 bpm (avg 197 bpm).   31 Supraventricular Tachycardia/atrial tachycardia runs occurred, the run with the fastest interval lasting 13 beats with a max rate of 150 bpm, the longest lasting 19 beats with an avg rate of 101 bpm. Some episodes of Supraventricular Tachycardia may be possible    Atrial Fibrillation occurred (25% burden), ranging from 30-204 bpm (avg of 89 bpm), the longest lasting 9 hours 39 mins with an avg rate of 106 bpm.    37 Pauses occurred, the longest lasting 4.4 secs (14 bpm).  Most pauses presenting while in atrial fibrillation overnight 2 AM to 5 AM Second Degree AV Block-Mobitz I (Wenckebach)  was present.  Junctional Rhythm was present.   Isolated SVEs were rare (<1.0%), SVE Couplets were rare(<1.0%), and SVE Triplets were rare (<1.0%).  Isolated VEs were rare (<1.0%, 317), VE Couplets were rare (<1.0%, 1), and VE Triplets were rare (<1.0%, 1).  Patient triggered events associated with normal sinus rhythm and atrial fibrillation   ASSESSMENT AND PLAN:  #) parox afib Continues to have paroxysms of afib Restart toprol 25mg  nightly Continue lopressor 25mg  PRN for tachycardia during afib episodes Patient is agreeable to starting AAD medication - will start multaq 400mg  BID.  Patient is acceptable ablation candidate, will defer final decision to Dr. Lalla Brothers  #) Hypercoag d/t parox afib CHA2DS2-VASc Score = 3 [CHF History: 0, HTN History: 1, Diabetes History: 1, Stroke History: 0, Vascular Disease History: 0, Age Score: 1, Gender Score: 0].  Therefore, the patient's annual risk of stroke is 3.2 %.  Stroke ppx - 5mg  eliquis BID, appropriately dosed No bleeding concerns  #) OSA  Mild OSA by recent sleep  Has not heart from pulm regarding next steps to treat  OSA Recommended he go by clinic after conclusion of our visit   Current medicines are reviewed at length with the patient today.   The patient does not have concerns regarding his medicines.  The following changes were made today:   START multaq 400mg  BID  Labs/ tests ordered today include:  Orders Placed This Encounter  Procedures   EKG 12-Lead     Disposition: Follow up with Dr. Lalla Brothers in  6 weeks  (MD only) to discuss AF ablation   Signed, Mike Don, NP  06/10/23  2:18 PM  Electrophysiology [facility]

## 2023-06-10 ENCOUNTER — Encounter: Payer: Self-pay | Admitting: Cardiology

## 2023-06-10 ENCOUNTER — Ambulatory Visit: Payer: Medicare Other | Attending: Cardiology | Admitting: Cardiology

## 2023-06-10 VITALS — BP 140/78 | HR 83 | Ht 77.0 in | Wt 279.0 lb

## 2023-06-10 DIAGNOSIS — D6869 Other thrombophilia: Secondary | ICD-10-CM | POA: Diagnosis not present

## 2023-06-10 DIAGNOSIS — I10 Essential (primary) hypertension: Secondary | ICD-10-CM | POA: Diagnosis not present

## 2023-06-10 DIAGNOSIS — I480 Paroxysmal atrial fibrillation: Secondary | ICD-10-CM | POA: Diagnosis not present

## 2023-06-10 MED ORDER — MULTAQ 400 MG PO TABS: 400.0000 mg | ORAL_TABLET | Freq: Two times a day (BID) | ORAL | 2 refills | Status: DC

## 2023-06-10 MED ORDER — METOPROLOL SUCCINATE ER 25 MG PO TB24: 25.0000 mg | ORAL_TABLET | Freq: Every day | ORAL | 3 refills | Status: DC

## 2023-06-10 NOTE — Patient Instructions (Signed)
Medication Instructions:    START TAKING MULTAQ  400 MG TWICE A DAY   *If you need a refill on your cardiac medications before your next appointment, please call your pharmacy*   Lab Work:  NONE ORDERED  TODAY   If you have labs (blood work) drawn today and your tests are completely normal, you will receive your results only by: MyChart Message (if you have MyChart) OR A paper copy in the mail If you have any lab test that is abnormal or we need to change your treatment, we will call you to review the results.   Testing/Procedures: NONE ORDERED  TODAY    Follow-Up: At [facility], you and your health needs are our priority.  As part of our continuing mission to provide you with exceptional heart care, we have created designated Provider Care Teams.  These Care Teams include your primary Cardiologist (physician) and Advanced Practice Providers (APPs -  Physician Assistants and Nurse Practitioners) who all work together to provide you with the care you need, when you need it.  We recommend signing up for the patient portal called "MyChart".  Sign up information is provided on this After Visit Summary.  MyChart is used to connect with patients for Virtual Visits (Telemedicine).  Patients are able to view lab/test results, encounter notes, upcoming appointments, etc.  Non-urgent messages can be sent to your provider as well.   To learn more about what you can do with MyChart, go to [email or web address].    Your next appointment:    6 week(s) to discuss ablation   Provider:   You may see. Dr. Lalla Brothers   Other Instructions

## 2023-06-11 DIAGNOSIS — G4733 Obstructive sleep apnea (adult) (pediatric): Secondary | ICD-10-CM

## 2023-06-11 NOTE — Telephone Encounter (Signed)
Called patient.  Left message for patient to call to give sleep study results.  Will also send MyChart message.  Patient last logged in today.  Will close encounter.

## 2023-07-12 ENCOUNTER — Encounter: Payer: Self-pay | Admitting: Primary Care

## 2023-07-12 ENCOUNTER — Telehealth: Payer: Medicare Other | Admitting: Primary Care

## 2023-07-12 DIAGNOSIS — G4730 Sleep apnea, unspecified: Secondary | ICD-10-CM

## 2023-07-12 DIAGNOSIS — E669 Obesity, unspecified: Secondary | ICD-10-CM | POA: Diagnosis not present

## 2023-07-12 DIAGNOSIS — I480 Paroxysmal atrial fibrillation: Secondary | ICD-10-CM | POA: Diagnosis not present

## 2023-07-12 NOTE — Progress Notes (Signed)
Virtual Visit via Video Note  I connected with Erline Levine. on 07/12/23 at  2:30 PM EDT by a video enabled telemedicine application and verified that I am speaking with the correct person using two identifiers.  Location: Patient: Home Provider: Office    I discussed the limitations of evaluation and management by telemedicine and the availability of in person appointments. The patient expressed understanding and agreed to proceed.  Discussed the use of AI scribe software for clinical note transcription with the patient, who gave verbal consent to proceed.  History of Present Illness: 72 year old male, never smoked.  Past medical history significant for hypertension, paroxysmal A-fib, asthma, type 2 diabetes, hyperlipidemia.    Previous [facility] pulmonary encounter:  05/15/2023 Patient presents today for sleep consult. He had sleep study > 10 years ago which was negative. His weight is up 40 lbs. He continues to have occasional snoring symptoms. He has woken himself up once before gasping or air. Sleep can be restless at times. He experiences some daytime sleepiness, he will at times take 20-25 min nap. Typical bedtime is between 1030 and 11:30 PM.  It can take 20 to 25 minutes to fall asleep.  He wakes up between 2-3 times a night.  He starts his day at 5:30 AM. He is primarily retired, works part times. Last episode of afib was month ago.  Epworth score 7. No concern for narcolepsy, cataplexy or sleepwalking.   07/12/2023- Interim hx  Patient contacted today for virtual visit for HST follow-up. He was seen for a sleep consult in September due to increased snoring. NPSG in September 2024 showed mild OSA, AHI 10/hour with SpO2 low 82%. The patient has never been on a CPAP machine before. The patient has noticed a correlation between his weight and the presence of sleep apnea symptoms. The patient's weight has increased recently, and he has contracted COVID-19 twice, which has disrupted his routine  and made it difficult for him to lose weight. The patient also has a history of parosyxmal atrial fibrillation and is currently on Eliquis, Multaq and Toprol 25mg  at bedtime and prn tachycardia . The patient's blood pressure is around 120/90, and his heart rate is around 75 beats per minute. He has an upcoming appointment with Dr. Lalla Brothers to discuss need for cardiac ablation.     Observations/Objective:  Appears well; No overt respiratory symptoms   Assessment and Plan:  1. Mild sleep apnea  2. Paroxysmal atrial fibrillation (HCC)  3. Obesity with sleep apnea   Obstructive Sleep Apnea (OSA)   - Newly diagnosed with mild OSA. Patient reports correlation between weight gain and snoring symptoms. NPSG in September 2024 showed mild OSA, AHI 10/hour with SpO2 low 82%. No prior CPAP use. Reviewed risks of untreated sleep apnea and treatment options. Discussed the correlation between weight and OSA, and the potential for weight loss to alleviate symptoms and potentially eliminate the need for CPAP. He is not interested in pursing CPAP therapy at the moment and would like to conservatively manage OSA by working on weight loss. Consider oral appliance if weight loss is not achieved or symptoms persist. Check in 6 months or sooner if cardiologist recommends CPAP.    Atrial Fibrillation (AFib)   Stable on Multaq 400mg  BID and Eliquis. No recent episodes. Upcoming appointment with cardiologist to discuss cardiac ablation.   -Continue Multaq 400mg  BID and Eliquis.   -Discuss OSA diagnosis with cardiologist and get his opinion on need for CPAP due to AFib.   -  If cardiac ablation is not successful or if cardiologist recommends CPAP patient to notify office   Weight Management   Patient reports weight gain since COVID-19 pandemic and expresses desire to lose weight. Previous weight loss correlated with resolution of snoring symptoms.   -Encourage continued efforts towards weight loss.   -Check in 6  months to assess progress and adjust OSA management plan as needed.      Follow Up Instructions:   6 moths or sooner if needed  I discussed the assessment and treatment plan with the patient. The patient was provided an opportunity to ask questions and all were answered. The patient agreed with the plan and demonstrated an understanding of the instructions.   The patient was advised to call back or seek an in-person evaluation if the symptoms worsen or if the condition fails to improve as anticipated.  I provided 22 minutes of non-face-to-face time during this encounter.   Glenford Bayley, NP

## 2023-07-12 NOTE — Patient Instructions (Signed)
Work on weight loss Discuss CPAP need with cardiologist due to afib FU in 6 months or sooner if needed

## 2023-07-20 ENCOUNTER — Other Ambulatory Visit: Payer: Self-pay | Admitting: Cardiology

## 2023-07-22 ENCOUNTER — Ambulatory Visit: Payer: Medicare Other | Admitting: Cardiology

## 2023-07-22 ENCOUNTER — Other Ambulatory Visit: Payer: Self-pay | Admitting: Cardiology

## 2023-07-23 NOTE — Progress Notes (Unsigned)
Electrophysiology Office Note:    Date:  07/24/2023   ID:  Mike Levine., DOB [birth year], MRN [id number]  [facility] Cardiologist:  Julien Nordmann, MD  [facility] Electrophysiologist:  [name], MD   Referring MD: Lynnea Ferrier, MD   Chief Complaint: AF  History of Present Illness:      Discussed the use of AI scribe software for clinical note transcription with the patient, who gave verbal consent to proceed.  History of Present Illness   Mike Miller, a 72 year old individual with a medical history significant for atrial fibrillation, asthma, diabetes, and hypertension, presents for evaluation of atrial fibrillation. He was diagnosed with atrial fibrillation in May of the current year, with outpatient monitoring revealing a 25% burden. He reports experiencing chest pain, shortness of breath, and fatigue when out of rhythm. Recently, he was started on Multaq (dronedarone) 400mg  twice daily for rhythm control. Since starting this medication, he reports a significant reduction in fatigue, despite remaining out of rhythm. He has not experienced any bleeding complications from his stroke prophylaxis medication, Eliquis.               Their past medical, social and family history was reveiwed.   ROS:   Please see the history of present illness.    All other systems reviewed and are negative.  EKGs/Labs/Other Studies Reviewed:    The following studies were reviewed today:    EKG Interpretation Date/Time:  Wednesday July 24 2023 15:04:27 EST Ventricular Rate:  69 PR Interval:    QRS Duration:  94 QT Interval:  380 QTC Calculation: 407 R Axis:   44  Text Interpretation: Atrial fibrillation Confirmed by Steffanie Dunn [id number] on 07/24/2023 3:06:05 PM    Physical Exam:    VS:  BP (!) 148/82 (BP Location: Left Arm, Patient Position: Sitting, Cuff Size: Large)   Pulse 69   Ht 6\' 5"  (1.956 m)   Wt 277 lb 6.4 oz (125.8 kg)   SpO2 97%   BMI  32.89 kg/m     Wt Readings from Last 3 Encounters:  07/24/23 277 lb 6.4 oz (125.8 kg)  06/10/23 279 lb (126.6 kg)  05/15/23 279 lb 12.8 oz (126.9 kg)     Physical Exam   GEN: well appearing, tall male CARD: irregularly irregular, rate in the 60s. No MRG RESP: No IWOB           ASSESSMENT AND PLAN:    1. Paroxysmal atrial fibrillation (HCC)   2. Primary hypertension       Assessment and Plan    Atrial Fibrillation Persistent symptoms of fatigue despite Multaq therapy. Discussed the efficacy of Multaq and the potential benefits of catheter ablation. Patient has a moderately dilated left atrium and an EF of 60%. -Discontinue Multaq. -Plan for catheter ablation. -Continue Eliquis for stroke prophylaxis.  Asthma, Diabetes, Hypertension No changes or concerns discussed in the conversation. -Continue current management as prescribed by primary care physician.         Discussed treatment options today for AF including antiarrhythmic drug therapy and ablation. Discussed risks, recovery and likelihood of success with each treatment strategy. Risk, benefits, and alternatives to EP study and ablation for afib were discussed. These risks include but are not limited to stroke, bleeding, vascular damage, tamponade, perforation, damage to the esophagus, lungs, phrenic nerve and other structures, pulmonary vein stenosis, worsening renal function, coronary vasospasm and death.  Discussed potential need for repeat ablation procedures and antiarrhythmic drugs after  an initial ablation. The patient understands these risk and wishes to proceed.  We will therefore proceed with catheter ablation at the next available time.  Carto, ICE, anesthesia are requested for the procedure.  Will also obtain CT PV protocol prior to the procedure to exclude LAA thrombus and further evaluate atrial anatomy.       Signed, Rossie Muskrat. Lalla Brothers, MD, [facility], [facility] 07/24/2023 3:22 PM    Electrophysiology [facility]

## 2023-07-24 ENCOUNTER — Encounter: Payer: Self-pay | Admitting: Cardiology

## 2023-07-24 ENCOUNTER — Ambulatory Visit: Payer: Medicare Other | Attending: Cardiology | Admitting: Cardiology

## 2023-07-24 VITALS — BP 148/82 | HR 69 | Ht 77.0 in | Wt 277.4 lb

## 2023-07-24 DIAGNOSIS — I10 Essential (primary) hypertension: Secondary | ICD-10-CM | POA: Insufficient documentation

## 2023-07-24 DIAGNOSIS — I480 Paroxysmal atrial fibrillation: Secondary | ICD-10-CM | POA: Insufficient documentation

## 2023-07-24 NOTE — Patient Instructions (Addendum)
Medication Instructions:  Your physician has recommended you make the following change in your medication: 1) STOP taking Multaq  *If you need a refill on your cardiac medications before your next appointment, please call your pharmacy*   Lab Work: Your provider would like for you to return  to have the following labs drawn: BMET and CBC.   Please go to [facility] [address] ([facility], [place] [address] You do not need an appointment.  They are open from 7:30 am-4 pm.  Lunch from 1:00 pm- 2:00 pm You do NOT need to be fasting.   Testing/Procedures: Your physician has requested that you have cardiac CT. Cardiac computed tomography (CT) is a painless test that uses an x-ray machine to take clear, detailed pictures of your heart.  We will call you to schedule your CT scan. It will be done about 2-3 weeks prior to your ablation.  Your physician has recommended that you have an ablation. Catheter ablation is a medical procedure used to treat some cardiac arrhythmias (irregular heartbeats). During catheter ablation, a long, thin, flexible tube is put into a blood vessel in your groin (upper thigh), or neck. This tube is called an ablation catheter. It is then guided to your heart through the blood vessel. Radio frequency waves destroy small areas of heart tissue where abnormal heartbeats may cause an arrhythmia to start.  You are scheduled for Atrial Fibrillation Ablation on Monday, February 10 with Dr. Steffanie Dunn.Please arrive at the Main Entrance A at [facility]: [address] [place], [place] [address] at 8:30 AM    Follow-Up: At [facility], you and your health needs are our priority.  As part of our continuing mission to provide you with exceptional heart care, we have created designated Provider Care Teams.  These Care Teams include your primary Cardiologist (physician) and Advanced Practice Providers (APPs -  Physician  Assistants and Nurse Practitioners) who all work together to provide you with the care you need, when you need it.  Your next appointment:   We will call you to schedule your follow up appointments.

## 2023-09-04 ENCOUNTER — Other Ambulatory Visit: Payer: Self-pay | Admitting: Cardiovascular Disease

## 2023-09-05 NOTE — Telephone Encounter (Signed)
Prescription refill request for Eliquis received. Indication:a fib Last office visit: 07/24/23 Scr: 0.96 03/26/23 epic Age: 72 Weight: 125kg

## 2023-09-05 NOTE — Telephone Encounter (Signed)
Refill request

## 2023-09-25 ENCOUNTER — Other Ambulatory Visit
Admission: RE | Admit: 2023-09-25 | Discharge: 2023-09-25 | Disposition: A | Discharge status: Home / Self Care | Payer: Medicare Other | Source: Ambulatory Visit | Attending: Cardiology | Admitting: Cardiology

## 2023-09-25 DIAGNOSIS — I10 Essential (primary) hypertension: Secondary | ICD-10-CM | POA: Insufficient documentation

## 2023-09-25 DIAGNOSIS — I480 Paroxysmal atrial fibrillation: Secondary | ICD-10-CM | POA: Diagnosis present

## 2023-09-25 LAB — CBC
HCT: 50 % (ref 39.0–52.0)
Hemoglobin: 16.1 g/dL (ref 13.0–17.0)
MCH: 27.5 pg (ref 26.0–34.0)
MCHC: 32.2 g/dL (ref 30.0–36.0)
MCV: 85.3 fL (ref 80.0–100.0)
Platelets: 293 10*3/uL (ref 150–400)
RBC: 5.86 MIL/uL — ABNORMAL HIGH (ref 4.22–5.81)
RDW: 15.4 % (ref 11.5–15.5)
WBC: 7 10*3/uL (ref 4.0–10.5)
nRBC: 0 % (ref 0.0–0.2)

## 2023-09-25 LAB — BASIC METABOLIC PANEL
Anion gap: 12 (ref 5–15)
BUN: 13 mg/dL (ref 8–23)
CO2: 23 mmol/L (ref 22–32)
Calcium: 9.8 mg/dL (ref 8.9–10.3)
Chloride: 103 mmol/L (ref 98–111)
Creatinine, Ser: 1.08 mg/dL (ref 0.61–1.24)
GFR, Estimated: >60 mL/min (ref >60)
Glucose, Bld: 99 mg/dL (ref 70–99)
Potassium: 4.3 mmol/L (ref 3.5–5.1)
Sodium: 138 mmol/L (ref 135–145)

## 2023-09-30 ENCOUNTER — Ambulatory Visit
Admission: RE | Admit: 2023-09-30 | Discharge: 2023-09-30 | Disposition: A | Discharge status: Home / Self Care | Payer: Medicare Other | Source: Ambulatory Visit | Attending: Cardiology | Admitting: Cardiology

## 2023-09-30 DIAGNOSIS — I480 Paroxysmal atrial fibrillation: Secondary | ICD-10-CM | POA: Insufficient documentation

## 2023-09-30 DIAGNOSIS — I10 Essential (primary) hypertension: Secondary | ICD-10-CM | POA: Insufficient documentation

## 2023-09-30 MED ORDER — SODIUM CHLORIDE 0.9 % IV BOLUS
125.0000 mL | Freq: Once | INTRAVENOUS | Status: AC
  Administered 2023-09-30: 125 mL via INTRAVENOUS

## 2023-09-30 MED ORDER — IOHEXOL 350 MG/ML SOLN
100.0000 mL | Freq: Once | INTRAVENOUS | Status: AC | PRN
  Administered 2023-09-30: 100 mL via INTRAVENOUS

## 2023-10-18 NOTE — Pre-Procedure Instructions (Addendum)
 Attempted to call patient regarding procedure instructions.  Left voicemail on the following items: Arrival time 0800 Nothing to eat or drink after midnight No meds AM of procedure Responsible person to drive you home and stay with you for 24 hrs  Have you missed any doses of anti-coagulant Eliquis- should be taken twice a day, if you have missed any doses please let us know.  Don't take dose on Monday morning.

## 2023-10-20 NOTE — Anesthesia Preprocedure Evaluation (Addendum)
 Anesthesia Evaluation  Patient identified by MRN, date of birth, ID band Patient awake    Reviewed: Allergy & Precautions, H&P , NPO status , Patient's Chart, lab work & pertinent test results  Airway Mallampati: III  TM Distance: >3 FB Neck ROM: Full    Dental no notable dental hx. (+) Teeth Intact, Dental Advisory Given   Pulmonary asthma (well controlled)  Sleep study a few months ago negative per pt   Pulmonary exam normal breath sounds clear to auscultation       Cardiovascular Exercise Tolerance: Good hypertension (137/86 preop), Pt. on medications and Pt. on home beta blockers + dysrhythmias (eliquis ) Atrial Fibrillation  Rhythm:Irregular Rate:Normal     Neuro/Psych negative neurological ROS  negative psych ROS   GI/Hepatic negative GI ROS, Neg liver ROS,,,  Endo/Other  diabetes, Well Controlled, Type 2, Oral Hypoglycemic Agents  BMI 32  Renal/GU Renal disease  negative genitourinary   Musculoskeletal   Abdominal  (+) + obese  Peds  Hematology negative hematology ROS (+)   Anesthesia Other Findings   Reproductive/Obstetrics negative OB ROS                             Anesthesia Physical Anesthesia Plan  ASA: 3  Anesthesia Plan: General   Post-op Pain Management: Tylenol  PO (pre-op)*   Induction: Intravenous  PONV Risk Score and Plan: 3 and Ondansetron , Dexamethasone  and Treatment may vary due to age or medical condition  Airway Management Planned: Oral ETT  Additional Equipment:   Intra-op Plan:   Post-operative Plan: Extubation in OR  Informed Consent: I have reviewed the patients History and Physical, chart, labs and discussed the procedure including the risks, benefits and alternatives for the proposed anesthesia with the patient or authorized representative who has indicated his/her understanding and acceptance.     Dental advisory given  Plan Discussed with:  CRNA  Anesthesia Plan Comments:        Anesthesia Quick Evaluation

## 2023-10-21 ENCOUNTER — Encounter (HOSPITAL_COMMUNITY)
Admission: RE | Disposition: A | Discharge status: Home / Self Care | Payer: Medicare Other | Source: Home / Self Care | Attending: Cardiology

## 2023-10-21 ENCOUNTER — Ambulatory Visit (HOSPITAL_COMMUNITY): Payer: Self-pay | Admitting: Anesthesiology

## 2023-10-21 ENCOUNTER — Other Ambulatory Visit: Payer: Self-pay

## 2023-10-21 ENCOUNTER — Ambulatory Visit (HOSPITAL_BASED_OUTPATIENT_CLINIC_OR_DEPARTMENT_OTHER): Payer: Self-pay | Admitting: Anesthesiology

## 2023-10-21 ENCOUNTER — Ambulatory Visit (HOSPITAL_COMMUNITY)
Admission: RE | Admit: 2023-10-21 | Discharge: 2023-10-21 | Disposition: A | Discharge status: Home / Self Care | Payer: Medicare Other | Attending: Cardiology | Admitting: Cardiology

## 2023-10-21 ENCOUNTER — Other Ambulatory Visit (HOSPITAL_COMMUNITY): Payer: Self-pay

## 2023-10-21 DIAGNOSIS — I4891 Unspecified atrial fibrillation: Secondary | ICD-10-CM | POA: Diagnosis not present

## 2023-10-21 DIAGNOSIS — J45909 Unspecified asthma, uncomplicated: Secondary | ICD-10-CM | POA: Insufficient documentation

## 2023-10-21 DIAGNOSIS — E785 Hyperlipidemia, unspecified: Secondary | ICD-10-CM | POA: Diagnosis not present

## 2023-10-21 DIAGNOSIS — Z7984 Long term (current) use of oral hypoglycemic drugs: Secondary | ICD-10-CM | POA: Insufficient documentation

## 2023-10-21 DIAGNOSIS — Z7901 Long term (current) use of anticoagulants: Secondary | ICD-10-CM | POA: Insufficient documentation

## 2023-10-21 DIAGNOSIS — I4819 Other persistent atrial fibrillation: Secondary | ICD-10-CM

## 2023-10-21 DIAGNOSIS — I10 Essential (primary) hypertension: Secondary | ICD-10-CM | POA: Diagnosis not present

## 2023-10-21 DIAGNOSIS — E119 Type 2 diabetes mellitus without complications: Secondary | ICD-10-CM | POA: Insufficient documentation

## 2023-10-21 HISTORY — PX: ATRIAL FIBRILLATION ABLATION: EP1191

## 2023-10-21 LAB — GLUCOSE, CAPILLARY: Glucose-Capillary: 105 mg/dL — ABNORMAL HIGH (ref 70–99)

## 2023-10-21 LAB — POCT ACTIVATED CLOTTING TIME: Activated Clotting Time: 285 s

## 2023-10-21 SURGERY — ATRIAL FIBRILLATION ABLATION
Anesthesia: General

## 2023-10-21 MED ORDER — COLCHICINE 0.6 MG PO TABS
0.6000 mg | ORAL_TABLET | Freq: Two times a day (BID) | ORAL | 0 refills | Status: DC
  Filled 2023-10-21: qty 10, 5d supply, fill #0

## 2023-10-21 MED ORDER — APIXABAN 5 MG PO TABS
5.0000 mg | ORAL_TABLET | Freq: Two times a day (BID) | ORAL | Status: DC
  Administered 2023-10-21: 5 mg via ORAL
  Filled 2023-10-21: qty 1

## 2023-10-21 MED ORDER — SODIUM CHLORIDE 0.9 % IV SOLN: INTRAVENOUS | Status: DC

## 2023-10-21 MED ORDER — ATROPINE SULFATE 1 MG/10ML IJ SOSY
PREFILLED_SYRINGE | INTRAMUSCULAR | Status: AC
  Filled 2023-10-21: qty 10

## 2023-10-21 MED ORDER — HEPARIN (PORCINE) IN NACL 1000-0.9 UT/500ML-% IV SOLN
INTRAVENOUS | Status: DC | PRN
  Administered 2023-10-21 (×3): 500 mL

## 2023-10-21 MED ORDER — DEXAMETHASONE SODIUM PHOSPHATE 4 MG/ML IJ SOLN
INTRAMUSCULAR | Status: DC | PRN
  Administered 2023-10-21: 5 mg via INTRAVENOUS

## 2023-10-21 MED ORDER — SODIUM CHLORIDE 0.9% FLUSH: 3.0000 mL | INTRAVENOUS | Status: DC | PRN

## 2023-10-21 MED ORDER — PANTOPRAZOLE SODIUM 40 MG PO TBEC
40.0000 mg | DELAYED_RELEASE_TABLET | Freq: Every day | ORAL | 0 refills | Status: DC
  Filled 2023-10-21: qty 45, 45d supply, fill #0

## 2023-10-21 MED ORDER — HEPARIN SODIUM (PORCINE) 1000 UNIT/ML IJ SOLN
INTRAMUSCULAR | Status: DC | PRN
  Administered 2023-10-21: 6000 [IU] via INTRAVENOUS
  Administered 2023-10-21: 19000 [IU] via INTRAVENOUS

## 2023-10-21 MED ORDER — PANTOPRAZOLE SODIUM 40 MG PO TBEC
40.0000 mg | DELAYED_RELEASE_TABLET | Freq: Every day | ORAL | Status: DC
  Administered 2023-10-21: 40 mg via ORAL
  Filled 2023-10-21: qty 1

## 2023-10-21 MED ORDER — ONDANSETRON HCL 4 MG/2ML IJ SOLN
4.0000 mg | Freq: Four times a day (QID) | INTRAMUSCULAR | Status: DC | PRN
Start: 2023-10-21 — End: 2023-10-21

## 2023-10-21 MED ORDER — ATROPINE SULFATE 0.4 MG/ML IV SOLN
INTRAVENOUS | Status: DC | PRN
Start: 2023-10-21 — End: 2023-10-21
  Administered 2023-10-21: 1 mg via INTRAVENOUS

## 2023-10-21 MED ORDER — ACETAMINOPHEN 325 MG PO TABS
650.0000 mg | ORAL_TABLET | ORAL | Status: DC | PRN
  Administered 2023-10-21: 650 mg via ORAL
  Filled 2023-10-21: qty 2

## 2023-10-21 MED ORDER — SODIUM CHLORIDE 0.9% FLUSH: 3.0000 mL | Freq: Two times a day (BID) | INTRAVENOUS | Status: DC

## 2023-10-21 MED ORDER — ROCURONIUM BROMIDE 100 MG/10ML IV SOLN
INTRAVENOUS | Status: DC | PRN
  Administered 2023-10-21: 20 mg via INTRAVENOUS
  Administered 2023-10-21: 70 mg via INTRAVENOUS

## 2023-10-21 MED ORDER — COLCHICINE 0.6 MG PO TABS
0.6000 mg | ORAL_TABLET | Freq: Two times a day (BID) | ORAL | Status: DC
  Administered 2023-10-21: 0.6 mg via ORAL
  Filled 2023-10-21: qty 1

## 2023-10-21 MED ORDER — PHENYLEPHRINE HCL-NACL 20-0.9 MG/250ML-% IV SOLN
INTRAVENOUS | Status: DC | PRN
Start: 2023-10-21 — End: 2023-10-21
  Administered 2023-10-21: 50 ug/min via INTRAVENOUS
  Administered 2023-10-21: 30 ug/min via INTRAVENOUS

## 2023-10-21 MED ORDER — SODIUM CHLORIDE 0.9 % IV SOLN: 250.0000 mL | INTRAVENOUS | Status: DC | PRN

## 2023-10-21 MED ORDER — PROPOFOL 10 MG/ML IV BOLUS
INTRAVENOUS | Status: DC | PRN
  Administered 2023-10-21 (×2): 100 mg via INTRAVENOUS

## 2023-10-21 MED ORDER — FENTANYL CITRATE (PF) 100 MCG/2ML IJ SOLN
INTRAMUSCULAR | Status: AC
  Filled 2023-10-21: qty 2

## 2023-10-21 MED ORDER — ONDANSETRON HCL 4 MG/2ML IJ SOLN
INTRAMUSCULAR | Status: DC | PRN
  Administered 2023-10-21: 4 mg via INTRAVENOUS

## 2023-10-21 MED ORDER — LACTATED RINGERS IV SOLN: INTRAVENOUS | Status: DC | PRN

## 2023-10-21 MED ORDER — SUGAMMADEX SODIUM 200 MG/2ML IV SOLN
INTRAVENOUS | Status: DC | PRN
  Administered 2023-10-21: 400 mg via INTRAVENOUS

## 2023-10-21 MED ORDER — FENTANYL CITRATE (PF) 100 MCG/2ML IJ SOLN
INTRAMUSCULAR | Status: DC | PRN
  Administered 2023-10-21: 100 ug via INTRAVENOUS

## 2023-10-21 MED ORDER — PROTAMINE SULFATE 10 MG/ML IV SOLN
INTRAVENOUS | Status: DC | PRN
  Administered 2023-10-21: 35 mg via INTRAVENOUS

## 2023-10-21 MED ORDER — PHENYLEPHRINE HCL (PRESSORS) 10 MG/ML IV SOLN
INTRAVENOUS | Status: DC | PRN
  Administered 2023-10-21 (×2): 160 ug via INTRAVENOUS

## 2023-10-21 MED ORDER — ACETAMINOPHEN 500 MG PO TABS
1000.0000 mg | ORAL_TABLET | Freq: Once | ORAL | Status: AC
  Administered 2023-10-21: 1000 mg via ORAL
  Filled 2023-10-21: qty 2

## 2023-10-21 SURGICAL SUPPLY — 20 items
BAG SNAP BAND KOVER 36X36 (MISCELLANEOUS) IMPLANT
CABLE PFA RX CATH CONN (CABLE) IMPLANT
CATH FARAWAVE ABLATION 31 (CATHETERS) IMPLANT
CATH OCTARAY 2.0 F 3-3-3-3-3 (CATHETERS) IMPLANT
CATH SOUNDSTAR ECO 8FR (CATHETERS) IMPLANT
CATH WEB BI DIR CSDF CRV REPRO (CATHETERS) IMPLANT
CLOSURE PERCLOSE PROSTYLE (VASCULAR PRODUCTS) IMPLANT
COVER SWIFTLINK CONNECTOR (BAG) ×1 IMPLANT
DILATOR VESSEL 38 20CM 16FR (INTRODUCER) IMPLANT
GUIDEWIRE INQWIRE 1.5J.035X260 (WIRE) IMPLANT
INQWIRE 1.5J .035X260CM (WIRE) ×1
KIT VERSACROSS CNCT FARADRIVE (KITS) IMPLANT
MAT PREVALON FULL STRYKER (MISCELLANEOUS) IMPLANT
PACK EP LF (CUSTOM PROCEDURE TRAY) ×1 IMPLANT
PAD DEFIB RADIO PHYSIO CONN (PAD) ×1 IMPLANT
PATCH CARTO3 (PAD) IMPLANT
SHEATH FARADRIVE STEERABLE (SHEATH) IMPLANT
SHEATH PINNACLE 8F 10CM (SHEATH) IMPLANT
SHEATH PINNACLE 9F 10CM (SHEATH) IMPLANT
SHEATH PROBE COVER 6X72 (BAG) IMPLANT

## 2023-10-21 NOTE — Anesthesia Procedure Notes (Signed)
 Procedure Name: Intubation Date/Time: 10/21/2023 10:02 AM  Performed by: Koreen Person, CRNAPre-anesthesia Checklist: Patient identified, Emergency Drugs available, Suction available, Patient being monitored and Timeout performed Patient Re-evaluated:Patient Re-evaluated prior to induction Oxygen Delivery Method: Circle system utilized Preoxygenation: Pre-oxygenation with 100% oxygen Induction Type: IV induction Ventilation: Mask ventilation with difficulty and Oral airway inserted - appropriate to patient size Laryngoscope Size: Mac and 4 Grade View: Grade III Tube type: Oral Number of attempts: 1 Airway Equipment and Method: Stylet Placement Confirmation: ETT inserted through vocal cords under direct vision, positive ETCO2, CO2 detector and breath sounds checked- equal and bilateral Secured at: 23 cm Tube secured with: Tape Dental Injury: Teeth and Oropharynx as per pre-operative assessment  Comments: Beard making mask leak with masking tech. 2 providers and oral aw used.

## 2023-10-21 NOTE — Discharge Instructions (Signed)

## 2023-10-21 NOTE — H&P (Signed)
 Electrophysiology Office Note:     Date:  10/21/2023    ID:  Mike Miller., DOB 11-Dec-1950, MRN [id number]   [facility] Cardiologist:  Belva Boyden, MD  [facility] Electrophysiologist:  Boyce Byes, MD    Referring MD: Melchor Spoon, MD    Chief Complaint: AF   History of Present Illness:         Discussed the use of AI scribe software for clinical note transcription with the patient, who gave verbal consent to proceed.   History of Present Illness   Mike Miller, a 73 year old individual with a medical history significant for atrial fibrillation, asthma, diabetes, and hypertension, presents for evaluation of atrial fibrillation. He was diagnosed with atrial fibrillation in May of the current year, with outpatient monitoring revealing a 25% burden. He reports experiencing chest pain, shortness of breath, and fatigue when out of rhythm. Recently, he was started on Multaq  (dronedarone ) 400mg  twice daily for rhythm control. Since starting this medication, he reports a significant reduction in fatigue, despite remaining out of rhythm. He has not experienced any bleeding complications from his stroke prophylaxis medication, Eliquis .        Presents for AF ablation today. Procedure reviewed.           Objective Their past medical, social and family history was reveiwed.     ROS:   Please see the history of present illness.    All other systems reviewed and are negative.   EKGs/Labs/Other Studies Reviewed:     The following studies were reviewed today:       EKG Interpretation Date/Time:                  Wednesday July 24 2023 15:04:27 EST Ventricular Rate:         69 PR Interval:                   QRS Duration:             94 QT Interval:                 380 QTC Calculation:407 R Axis:                         44   Text Interpretation:Atrial fibrillation Confirmed by Harvie Liner [phone]) on 07/24/2023 3:06:05 PM      Physical Exam:     VS:   BP 137/86 (BP Location: Left Arm, Patient Position: Sitting, Cuff Size: Large)   Pulse 85   Ht 6\' 5"  (1.956 m)   Wt 277 lb 6.4 oz (125.8 kg)   SpO2 97%   BMI 32.89 kg/m         Wt Readings from Last 3 Encounters:  07/24/23 277 lb 6.4 oz (125.8 kg)  06/10/23 279 lb (126.6 kg)  05/15/23 279 lb 12.8 oz (126.9 kg)      Physical Exam   GEN: well appearing, tall male CARD: irregularly irregular, rate in the 60s. No MRG RESP: No IWOB           Assessment ASSESSMENT AND PLAN:     1. Paroxysmal atrial fibrillation (HCC)   2. Primary hypertension           Assessment and Plan    Atrial Fibrillation Persistent symptoms of fatigue despite Multaq  therapy. Discussed the efficacy of Multaq  and the potential benefits of catheter ablation. Patient has a moderately dilated left atrium and  an EF of 60%. -Discontinue Multaq . -Plan for catheter ablation. -Continue Eliquis  for stroke prophylaxis.   Asthma, Diabetes, Hypertension No changes or concerns discussed in the conversation. -Continue current management as prescribed by primary care physician.             Discussed treatment options today for AF including antiarrhythmic drug therapy and ablation. Discussed risks, recovery and likelihood of success with each treatment strategy. Risk, benefits, and alternatives to EP study and ablation for afib were discussed. These risks include but are not limited to stroke, bleeding, vascular damage, tamponade, perforation, damage to the esophagus, lungs, phrenic nerve and other structures, pulmonary vein stenosis, worsening renal function, coronary vasospasm and death.  Discussed potential need for repeat ablation procedures and antiarrhythmic drugs after an initial ablation. The patient understands these risk and wishes to proceed.  We will therefore proceed with catheter ablation at the next available time.  Carto, ICE, anesthesia are requested for the procedure.  Will also obtain CT PV protocol  prior to the procedure to exclude LAA thrombus and further evaluate atrial anatomy.        Presents for AF ablation today. Procedure reviewed.     Signed, Leanora Prophet. Marven Slimmer, MD, [facility], [facility] 10/21/2023 Electrophysiology [facility]

## 2023-10-21 NOTE — Progress Notes (Signed)
 Pt ambulated without difficulty or bleeding.  Discharge instructions given to pt and wife who verbalize understanding and deny further questions.  Discharged home with  wife who will drive and stay with pt x 24 hrs.

## 2023-10-21 NOTE — Anesthesia Postprocedure Evaluation (Signed)
 Anesthesia Post Note  Patient: Mike Miller.  Procedure(s) Performed: ATRIAL FIBRILLATION ABLATION     Patient location during evaluation: Cath Lab Anesthesia Type: General Level of consciousness: awake and alert Pain management: pain level controlled Vital Signs Assessment: post-procedure vital signs reviewed and stable Respiratory status: spontaneous breathing, nonlabored ventilation and respiratory function stable Cardiovascular status: blood pressure returned to baseline and stable Postop Assessment: no apparent nausea or vomiting Anesthetic complications: no  No notable events documented.  Last Vitals:  Vitals:   10/21/23 1211 10/21/23 1233  BP: 107/62 106/64  Pulse: 73 71  Resp: 12 14  Temp:    SpO2: 98% 97%    Last Pain:  Vitals:   10/21/23 1233  TempSrc:   PainSc: 0-No pain                 Draycen Leichter,W. EDMOND

## 2023-10-21 NOTE — Transfer of Care (Signed)
 Immediate Anesthesia Transfer of Care Note  Patient: Mike Miller.  Procedure(s) Performed: ATRIAL FIBRILLATION ABLATION  Patient Location: PACU  Anesthesia Type:General  Level of Consciousness: awake, alert , and oriented  Airway & Oxygen Therapy: Patient Spontanous Breathing and Patient connected to nasal cannula oxygen  Post-op Assessment: Report given to RN and Post -op Vital signs reviewed and stable  Post vital signs: Reviewed and stable  Last Vitals:  Vitals Value Taken Time  BP    Temp    Pulse    Resp    SpO2      Last Pain:  Vitals:   10/21/23 0809  TempSrc:   PainSc: 0-No pain      Patients Stated Pain Goal: 4 (10/21/23 0809)  Complications: No notable events documented.

## 2023-10-22 ENCOUNTER — Encounter (HOSPITAL_COMMUNITY): Payer: Self-pay | Admitting: Cardiology

## 2023-10-22 ENCOUNTER — Telehealth (HOSPITAL_COMMUNITY): Payer: Self-pay

## 2023-10-22 MED FILL — Atropine Sulfate Soln Prefill Syr 1 MG/10ML (0.1 MG/ML): INTRAMUSCULAR | Qty: 10 | Status: AC

## 2023-10-22 NOTE — Telephone Encounter (Signed)
Spoke with patient to complete post procedure follow up call.   Patient noticed a small amount of blood on the right groin gauze, but did not extend pass it. He denies any complications to groin sites.   Instructions reviewed with patient:  Remove large bandage at puncture site after 24 hours. It is normal to have bruising, tenderness and a pea or marble sized lump/knot at the groin site which can take up to three months to resolve.  Get help right away if you notice sudden swelling at the puncture site.  Check your puncture site every day for signs of infection: fever, redness, swelling, pus drainage, warmth, foul odor or excessive pain. If this occurs, please call the office at [phone], to speak with the nurse. Get help right away if your puncture site is bleeding and the bleeding does not stop after applying firm pressure to the area.  You may continue to have skipped beats/ atrial fibrillation during the first several months after your procedure.  It is very important not to miss any doses of your blood thinner ELIQUIS. He restarted medication at 1230 AM.    You will follow up with the APP on 11/25/23 and 01/20/24.   Patient verbalized understanding to all instructions provided.

## 2023-11-23 NOTE — Progress Notes (Unsigned)
 Electrophysiology Clinic Note    Date:  11/25/2023  Patient ID:  Mike Miller., DOB 1951-07-28, MRN [id number] PCP:  Lynnea Ferrier, MD  Cardiologist:  Julien Nordmann, MD Electrophysiologist: [name], MD   Discussed the use of AI scribe software for clinical note transcription with the patient, who gave verbal consent to proceed.   Patient Profile    Chief Complaint: AF ablation follow-up  History of Present Illness: Mike Miller. is a 73 y.o. male with PMH notable for parox AFib, asthma, T2DM, HTN; seen today for [name], MD for routine electrophysiology followup.  Afib initially diagnosed 01/2023 after presenting to ER, main symptom fatigue, chest pain, SOB, and palpitations. I saw him in follow-up and we agreed to start AAD, decided on multaq.  He is s/p AF ablation w PVI, posterior wall on 10/21/23 by Dr. Lalla Brothers.   On follow-up today, his apple watch and BP machine have indicated AFib episodes, though he has been asymptomatic of the episodes which is not typical. He checks his BP several times a week, all readings 130-150 systolic, rarely 086. He attributes the elevated BP readings to a lack of exercise. Says he had been taking it easy since his ablation. Bilateral groins are well-healed without bruising or areas of firmness.  He has finished his colchicine and PPI postablation meds.  He continues to take eliquis BID, no bleeding concerns.    AAD History: Multaq - ineffective, stopped    ROS:  Please see the history of present illness. All other systems are reviewed and otherwise negative.    Physical Exam    VS:  BP (!) 161/73 (BP Location: Left Arm, Patient Position: Sitting, Cuff Size: Normal)   Pulse 63   Ht 6\' 5"  (1.956 m)   Wt 283 lb 6.4 oz (128.5 kg)   SpO2 97%   BMI 33.61 kg/m  BMI: Body mass index is 33.61 kg/m.  Wt Readings from Last 3 Encounters:  11/25/23 283 lb 6.4 oz (128.5 kg)  10/21/23 270 lb (122.5 kg)  07/24/23  277 lb 6.4 oz (125.8 kg)     GEN- The patient is well appearing, alert and oriented x 3 today.   Lungs- Clear to ausculation bilaterally, normal work of breathing.  Heart- Regular rate and rhythm, no murmurs, rubs or gallops Extremities- No peripheral edema, warm, dry    Studies Reviewed   Previous EP, cardiology notes.    EKG is ordered. Personal review of EKG from today shows:    EKG Interpretation Date/Time:  Monday November 25 2023 10:34:51 EDT Ventricular Rate:  63 PR Interval:  274 QRS Duration:  94 QT Interval:  412 QTC Calculation: 421 R Axis:   -18  Text Interpretation: Sinus rhythm with 1st degree A-V block with Premature atrial complexes Confirmed by Mike Miller [phone]) on 11/25/2023 10:46:26 AM    TTE, 04/11/2023  1. Left ventricular ejection fraction, by estimation, is 60 to 65%. Left ventricular ejection fraction by PLAX is 59 %. The left ventricle has normal function. The left ventricle has no regional wall motion abnormalities. Left ventricular diastolic parameters are indeterminate.   2. Right ventricular systolic function is normal. The right ventricular size is normal.   3. Left atrial size was moderately dilated.   4. The mitral valve is normal in structure. Mild mitral valve regurgitation. No evidence of mitral stenosis.   5. The aortic valve is normal in structure. Aortic valve regurgitation is not  visualized. No aortic stenosis is present.   6. There is mild dilatation of the aortic root, measuring 40 mm.   7. The inferior vena cava is normal in size with greater than 50% respiratory variability, suggesting right atrial pressure of 3 mmHg.    Long term monitor, 04/02/2023 Monitor 1 Normal sinus rhythm Patient had a min HR of 43 bpm, max HR of 148 bpm, and avg HR of 67 bpm.  11 Supraventricular Tachycardia runs occurred, the run with the fastest interval lasting 6 beats with a max rate of 148 bpm, the longest lasting 9.2 secs with an avg rate of 115 bpm.    Isolated SVEs were rare (<1.0%), SVE Couplets were rare (<1.0%), and SVE Triplets were rare (<1.0%).  Isolated VEs were rare (<1.0%), VE Couplets were rare (<1.0%), and no VE Triplets were present.  Triggered events (2) were associated with normal sinus rhythm   Monitor 2 Normal sinus rhythm Patient had a min HR of 29 bpm, max HR of 210 bpm, and avg HR of 69 bpm.   1 run of Ventricular Tachycardia occurred lasting 7 beats with a max rate of 210 bpm (avg 197 bpm).   31 Supraventricular Tachycardia/atrial tachycardia runs occurred, the run with the fastest interval lasting 13 beats with a max rate of 150 bpm, the longest lasting 19 beats with an avg rate of 101 bpm. Some episodes of Supraventricular Tachycardia may be possible    Atrial Fibrillation occurred (25% burden), ranging from 30-204 bpm (avg of 89 bpm), the longest lasting 9 hours 39 mins with an avg rate of 106 bpm.    37 Pauses occurred, the longest lasting 4.4 secs (14 bpm).  Most pauses presenting while in atrial fibrillation overnight 2 AM to 5 AM Second Degree AV Block-Mobitz I (Wenckebach) was present.  Junctional Rhythm was present.   Isolated SVEs were rare (<1.0%), SVE Couplets were rare(<1.0%), and SVE Triplets were rare (<1.0%).  Isolated VEs were rare (<1.0%, 317), VE Couplets were rare (<1.0%, 1), and VE Triplets were rare (<1.0%, 1).  Patient triggered events associated with normal sinus rhythm and atrial fibrillation       Assessment and Plan     #) parox Afib S/p AF ablation 10/2023 by Dr. Lalla Brothers Review of apple watch readings are sinus with PACs EKG today with single PAC Reassurance and education provided. Recommended he still monitor arrhythmia with apple watch and BP machine Continue 25mg  toprol nightly  #) Hypercoag d/t parox afib CHA2DS2-VASc Score = at least 3 [CHF History: 0, HTN History: 1, Diabetes History: 1, Stroke History: 0, Vascular Disease History: 0, Age Score: 1, Gender Score: 0].   Therefore, the patient's annual risk of stroke is 3.2 %.    Stroke ppx - 5mg  eliquis BID, appropriately dosed No bleeding concerns   #) HTN Consistently elevated readings by home measurements Discussed proper BP measurement technique, which he is following Continue to check BP regularly at home He does not wish to make medication adjustments today, wants to return to exercise and then reassess, which I think is reasonable Recommended lifestyle modifications - increase physical activity, reduce dietary sodium He has PCP appt in 1 month to further eval Continue toprol as above Continue 20mg  telmisartan, 25mg  jardiance      Current medicines are reviewed at length with the patient today.   The patient does not have concerns regarding his medicines.  The following changes were made today:  none  Labs/ tests ordered today include:  Orders Placed This Encounter  Procedures   EKG 12-Lead     Disposition: Follow up with Dr. Lalla Brothers or EP APP  2 months    Signed, Mike Don, NP  11/25/23  12:09 PM  Electrophysiology [facility]

## 2023-11-25 ENCOUNTER — Ambulatory Visit: Payer: Medicare Other | Attending: Cardiology | Admitting: Cardiology

## 2023-11-25 VITALS — BP 161/73 | HR 63 | Ht 77.0 in | Wt 283.4 lb

## 2023-11-25 DIAGNOSIS — I480 Paroxysmal atrial fibrillation: Secondary | ICD-10-CM | POA: Diagnosis not present

## 2023-11-25 DIAGNOSIS — I10 Essential (primary) hypertension: Secondary | ICD-10-CM | POA: Insufficient documentation

## 2023-11-25 DIAGNOSIS — D6869 Other thrombophilia: Secondary | ICD-10-CM | POA: Insufficient documentation

## 2023-11-25 NOTE — Patient Instructions (Signed)
 Medication Instructions:  The current medical regimen is effective;  continue present plan and medications as directed. Please refer to the Current Medication list given to you today.   *If you need a refill on your cardiac medications before your next appointment, please call your pharmacy*   Follow-Up: At [facility], you and your health needs are our priority.  As part of our continuing mission to provide you with exceptional heart care, we have created designated Provider Care Teams.  These Care Teams include your primary Cardiologist (physician) and Advanced Practice Providers (APPs -  Physician Assistants and Nurse Practitioners) who all work together to provide you with the care you need, when you need it.  We recommend signing up for the patient portal called "MyChart".  Sign up information is provided on this After Visit Summary.  MyChart is used to connect with patients for Virtual Visits (Telemedicine).  Patients are able to view lab/test results, encounter notes, upcoming appointments, etc.  Non-urgent messages can be sent to your provider as well.   To learn more about what you can do with MyChart, go to [email or web address].    Your next appointment:   Keep scheduled

## 2024-01-08 ENCOUNTER — Telehealth (HOSPITAL_BASED_OUTPATIENT_CLINIC_OR_DEPARTMENT_OTHER): Payer: Self-pay | Admitting: *Deleted

## 2024-01-08 NOTE — Telephone Encounter (Signed)
   Pre-operative Risk Assessment    Patient Name: Mike Miller.  DOB: 17-Mar-1951 MRN: [id number]   Date of last office visit: 11/25/2023 Date of next office visit: 01/21/2024, Pre Op added to upcoming appt notes.   Request for Surgical Clearance    Procedure:  Hammertoe Repair left 5th toe and exostectomy left 4 toe  Date of Surgery:  Clearance 01/29/24                                 Surgeon:  Not Indicated. Surgeon's Group or Practice Name:  [facility] Phone number:  [phone] Fax number:  [phone]   Type of Clearance Requested:   - Medical  - Pharmacy:  Hold Apixaban  (Eliquis ) Not Indicated.   Type of Anesthesia:  Not Indicated   Additional requests/questions:    Signed, Lauris Port   01/08/2024, 3:02 PM

## 2024-01-09 ENCOUNTER — Other Ambulatory Visit: Payer: Self-pay | Admitting: Podiatry

## 2024-01-10 ENCOUNTER — Other Ambulatory Visit: Payer: Self-pay | Admitting: Podiatry

## 2024-01-14 NOTE — Telephone Encounter (Signed)
 Primary Cardiologist:Timothy Jerelene Monday, MD  Chart reviewed as part of pre-operative protocol coverage. Because of BREXTON JASKOLSKI Jr.'s past medical history and time since last visit, he/she will require a follow-up visit in order to better assess preoperative cardiovascular risk.  Pre-op covering staff: - Patient has appointment with Suzann Riddle, NP on 01/21/24 at which time clearance will be addressed. Appointment notes have been updated.  - Please contact requesting surgeon's office via preferred method (i.e, phone, fax) to inform them of need for appointment prior to surgery.  Patient has had an Afib/aflutter ablation within the last 3 months :  10/21/23  (will be > 3 months at date of procedure)   Per office protocol, patient can hold Eliquis  for 2 days prior to procedure.   Patient will not need bridging with Lovenox (enoxaparin) around procedure.  Gerldine Koch, NP-C  01/14/2024, 3:48 PM [address] [place], [place] [address] Office [phone] Fax [phone]

## 2024-01-14 NOTE — Telephone Encounter (Signed)
 I will update all parties involved pt has appt 01/21/24 with Suzann Riddle, NP. Per preop APP to address clearance at the time.

## 2024-01-14 NOTE — Telephone Encounter (Signed)
 Patient with diagnosis of atrial fibrillation on Eliquis  for anticoagulation.    Procedure:  Hammertoe Repair left 5th toe and exostectomy left 4 toe   Date of Surgery:  Clearance 01/29/24      CHA2DS2-VASc Score = 3   This indicates a 3.2% annual risk of stroke. The patient's score is based upon: CHF History: 0 HTN History: 1 Diabetes History: 1 Stroke History: 0 Vascular Disease History: 0 Age Score: 1 Gender Score: 0   CrCl 112 Platelet count 306  Patient has had an Afib/aflutter ablation within the last 3 months :  10/21/23  (will be > 3 months at date of procedure)  Per office protocol, patient can hold Eliquis  for 2 days prior to procedure.   Patient will not need bridging with Lovenox (enoxaparin) around procedure.  **This guidance is not considered finalized until pre-operative APP has relayed final recommendations.**

## 2024-01-20 ENCOUNTER — Ambulatory Visit: Payer: Medicare Other | Admitting: Cardiology

## 2024-01-20 NOTE — Progress Notes (Unsigned)
 Electrophysiology Clinic Note    Date:  01/21/2024  Patient ID:  Mike Schacht., DOB 10/13/1950, MRN [id number] PCP:  Melchor Spoon, MD  Cardiologist:  Belva Boyden, MD Electrophysiologist: Boyce Byes, MD   Discussed the use of AI scribe software for clinical note transcription with the patient, who gave verbal consent to proceed.   Patient Profile    Chief Complaint: AF ablation follow-up, pre-op clearance for hammertoe repair surgery  History of Present Illness: Mike Rehor. is a 74 y.o. male with PMH notable for parox AFib, asthma, T2DM, HTN; seen today for Boyce Byes, MD for routine electrophysiology followup.  Afib initially diagnosed 01/2023 after presenting to ER, main symptom fatigue, chest pain, SOB, and palpitations. I saw him in follow-up and we agreed to start AAD, decided on multaq .   He is s/p AF ablation w PVI, posterior wall on 10/21/23 by Dr. Marven Slimmer.  I saw him for 1 month post-ablation appt where his BP machine and apple watch had indicated AFib, but he was asymptomatic. Review of watch readings were SR with PACs. Previously, always symptomatic when in afib. BP elevated, but pt preferred lifestyle modifications prior to adjusting meds.   On follow-up today, he is doing very well without any episodes of AFib. He continues to wear his apple watch - no triggers for afib. He continues to take eliquis  BID, no bleeding concerns. He does not enjoy paying 240/month for eliquis , but can afford it.   He checks BP in the AM prior to medications, most readings 130-140 systolic. He has rare episodes of dizziness with position changes - like bending down tying shoes and standing up quickly.   He denies chest pain, chest pressure, SOB, or palpitations. No edema, has good exercise tolerance.  AAD History: Multaq  - ineffective, stopped    ROS:  Please see the history of present illness. All other systems are reviewed and otherwise negative.     Physical Exam    VS:  BP (!) 144/82 (BP Location: Left Arm)   Pulse 62   Ht 6\' 5"  (1.956 m)   Wt 277 lb (125.6 kg)   SpO2 97%   BMI 32.85 kg/m  BMI: Body mass index is 32.85 kg/m.  Wt Readings from Last 3 Encounters:  01/21/24 277 lb (125.6 kg)  11/25/23 283 lb 6.4 oz (128.5 kg)  10/21/23 270 lb (122.5 kg)     GEN- The patient is well appearing, alert and oriented x 3 today.   Lungs- Clear to ausculation bilaterally, normal work of breathing.  Heart- Regular rate and rhythm, no murmurs, rubs or gallops Extremities- No peripheral edema, warm, dry    Studies Reviewed   Previous EP, cardiology notes.    EKG is ordered. Personal review of EKG from today shows:    EKG Interpretation Date/Time:  Tuesday Jan 21 2024 13:22:54 EDT Ventricular Rate:  62 PR Interval:  274 QRS Duration:  92 QT Interval:  416 QTC Calculation: 422 R Axis:   -20  Text Interpretation: Sinus rhythm with 1st degree A-V block When compared with ECG of 25-Nov-2023 10:34, Premature atrial complexes are no longer Present Confirmed by Jacobs Golab [phone]) on 01/21/2024 1:28:58 PM    TTE, 04/11/2023  1. Left ventricular ejection fraction, by estimation, is 60 to 65%. Left ventricular ejection fraction by PLAX is 59 %. The left ventricle has normal function. The left ventricle has no regional wall motion abnormalities. Left ventricular diastolic parameters  are indeterminate.   2. Right ventricular systolic function is normal. The right ventricular size is normal.   3. Left atrial size was moderately dilated.   4. The mitral valve is normal in structure. Mild mitral valve regurgitation. No evidence of mitral stenosis.   5. The aortic valve is normal in structure. Aortic valve regurgitation is not visualized. No aortic stenosis is present.   6. There is mild dilatation of the aortic root, measuring 40 mm.   7. The inferior vena cava is normal in size with greater than 50% respiratory variability, suggesting  right atrial pressure of 3 mmHg.    Long term monitor, 04/02/2023 Monitor 1 Normal sinus rhythm Patient had a min HR of 43 bpm, max HR of 148 bpm, and avg HR of 67 bpm.  11 Supraventricular Tachycardia runs occurred, the run with the fastest interval lasting 6 beats with a max rate of 148 bpm, the longest lasting 9.2 secs with an avg rate of 115 bpm.   Isolated SVEs were rare (<1.0%), SVE Couplets were rare (<1.0%), and SVE Triplets were rare (<1.0%).  Isolated VEs were rare (<1.0%), VE Couplets were rare (<1.0%), and no VE Triplets were present.  Triggered events (2) were associated with normal sinus rhythm   Monitor 2 Normal sinus rhythm Patient had a min HR of 29 bpm, max HR of 210 bpm, and avg HR of 69 bpm.   1 run of Ventricular Tachycardia occurred lasting 7 beats with a max rate of 210 bpm (avg 197 bpm).   31 Supraventricular Tachycardia/atrial tachycardia runs occurred, the run with the fastest interval lasting 13 beats with a max rate of 150 bpm, the longest lasting 19 beats with an avg rate of 101 bpm. Some episodes of Supraventricular Tachycardia may be possible    Atrial Fibrillation occurred (25% burden), ranging from 30-204 bpm (avg of 89 bpm), the longest lasting 9 hours 39 mins with an avg rate of 106 bpm.    37 Pauses occurred, the longest lasting 4.4 secs (14 bpm).  Most pauses presenting while in atrial fibrillation overnight 2 AM to 5 AM Second Degree AV Block-Mobitz I (Wenckebach) was present.  Junctional Rhythm was present.   Isolated SVEs were rare (<1.0%), SVE Couplets were rare(<1.0%), and SVE Triplets were rare (<1.0%).  Isolated VEs were rare (<1.0%, 317), VE Couplets were rare (<1.0%, 1), and VE Triplets were rare (<1.0%, 1).  Patient triggered events associated with normal sinus rhythm and atrial fibrillation       Assessment and Plan     #) parox Afib S/p AF ablation 10/2023 by Dr. Marven Slimmer Maintaining sinus rhtyhm, no PACs today Continue 25mg  toprol   nightly  #) Hypercoag d/t parox afib CHA2DS2-VASc Score = at least 3 [CHF History: 0, HTN History: 1, Diabetes History: 1, Stroke History: 0, Vascular Disease History: 0, Age Score: 1, Gender Score: 0].  Therefore, the patient's annual risk of stroke is 3.2 %.    Stroke ppx - 5mg  eliquis  BID, appropriately dosed No bleeding concerns Test claim to [facility] outpatient pharmacy = $4.52 -- will transition eliquis  to [facility] pharmacy   #) pre-op evaluation RCRI = 0, indicating a 0.4% risk of perioperative major cardiac risk He is greater than 3mon from AF ablation, ok to hold eliquis  for 2 days prior to procedure, and resume when appropriate post-operatively No further workup or evaluation is needed    Current medicines are reviewed at length with the patient today.   The patient does not have  concerns regarding his medicines.  The following changes were made today:  none  Labs/ tests ordered today include:  Orders Placed This Encounter  Procedures   EKG 12-Lead     Disposition: Follow up with Dr. Marven Slimmer or EP APP 2 months    Signed, Adaline Holly, NP  01/21/24  1:56 PM  Electrophysiology [facility]

## 2024-01-21 ENCOUNTER — Other Ambulatory Visit: Payer: Self-pay

## 2024-01-21 ENCOUNTER — Other Ambulatory Visit (HOSPITAL_COMMUNITY): Payer: Self-pay

## 2024-01-21 ENCOUNTER — Telehealth (HOSPITAL_COMMUNITY): Payer: Self-pay | Admitting: Pharmacy Technician

## 2024-01-21 ENCOUNTER — Ambulatory Visit: Attending: Cardiology | Admitting: Cardiology

## 2024-01-21 VITALS — BP 144/82 | HR 62 | Ht 77.0 in | Wt 277.0 lb

## 2024-01-21 DIAGNOSIS — Z01818 Encounter for other preprocedural examination: Secondary | ICD-10-CM

## 2024-01-21 DIAGNOSIS — I10 Essential (primary) hypertension: Secondary | ICD-10-CM

## 2024-01-21 DIAGNOSIS — D6869 Other thrombophilia: Secondary | ICD-10-CM

## 2024-01-21 DIAGNOSIS — I480 Paroxysmal atrial fibrillation: Secondary | ICD-10-CM

## 2024-01-21 MED ORDER — APIXABAN 5 MG PO TABS
5.0000 mg | ORAL_TABLET | Freq: Two times a day (BID) | ORAL | 3 refills | Status: DC
  Filled 2024-01-21 (×2): qty 180, 90d supply, fill #0

## 2024-01-21 NOTE — Telephone Encounter (Signed)
 Patient Product/process development scientist completed.    The patient is insured through [employer]. Patient has Medicare and is not eligible for a copay card, but may be able to apply for patient assistance or Medicare RX Payment Plan (Patient Must reach out to their plan, if eligible for payment plan), if available.    Ran test claim for Eliquis  5 mg and the current 90 day co-pay is $4.52.   This test claim was processed through  [facility]- copay amounts may vary at other pharmacies due to pharmacy/plan contracts, or as the patient moves through the different stages of their insurance plan.     Morgan Arab, CPHT Pharmacy Technician III Certified Patient Advocate [facility] Patient Advocate Team Direct Number: [phone]  Fax: [phone]

## 2024-01-21 NOTE — Patient Instructions (Signed)
 Medication Instructions:  The current medical regimen is effective;  continue present plan and medications as directed. Please refer to the Current Medication list given to you today.   *If you need a refill on your cardiac medications before your next appointment, please call your pharmacy*  Follow-Up: At [facility], you and your health needs are our priority.  As part of our continuing mission to provide you with exceptional heart care, our providers are all part of one team.  This team includes your primary Cardiologist (physician) and Advanced Practice Providers or APPs (Physician Assistants and Nurse Practitioners) who all work together to provide you with the care you need, when you need it.  Your next appointment:   12 month(s)  Provider:   Steffanie Dunn, MD or Sherie Don, NP    We recommend signing up for the patient portal called "MyChart".  Sign up information is provided on this After Visit Summary.  MyChart is used to connect with patients for Virtual Visits (Telemedicine).  Patients are able to view lab/test results, encounter notes, upcoming appointments, etc.  Non-urgent messages can be sent to your provider as well.   To learn more about what you can do with MyChart, go to [email or web address].

## 2024-01-29 ENCOUNTER — Encounter: Payer: Self-pay | Admitting: Urology

## 2024-01-29 ENCOUNTER — Ambulatory Visit (INDEPENDENT_AMBULATORY_CARE_PROVIDER_SITE_OTHER): Admitting: Urology

## 2024-01-29 VITALS — BP 155/86 | HR 81 | Ht 77.0 in | Wt 275.0 lb

## 2024-01-29 DIAGNOSIS — R972 Elevated prostate specific antigen [PSA]: Secondary | ICD-10-CM | POA: Diagnosis not present

## 2024-01-29 NOTE — Progress Notes (Signed)
 I, Mike Miller, acting as a Neurosurgeon for Mike Knapp, MD., have documented all relevant documentation on the behalf of Mike Knapp, MD, as directed by Mike Knapp, MD while in the presence of Mike Knapp, MD.  Discussed the use of AI scribe software for clinical note transcription with the patient, who gave verbal consent to proceed.   01/29/2024 4:19 PM   Mike Miller. June 13, 1951 [id number]  Referring provider: Melchor Spoon, MD [address] [facility] [place],  [place] [address]  Chief Complaint  Patient presents with   Elevated PSA    HPI: Mike Miller. is a 73 y.o. male referred for evaluation of an elevated PSA.  PSA 01/28/2024 slightly elevated at 5.12, prior PSA results remarkable for a PSA of 3.66 May 2024, 4.24 November 2022, 2.80 March 2023, 3.50 September 2022, and 2.73 April 2022.  No bothersome lower urinary tract symptoms. Denies dysuria, gross hematuria, or recurrent UTIs No family history of prostate cancer in first-degree relatives.   PMH: Past Medical History:  Diagnosis Date   Asthma    Atrial fibrillation (HCC)    Hyperlipidemia    Hypertension     Surgical History: Past Surgical History:  Procedure Laterality Date   ATRIAL FIBRILLATION ABLATION N/A 10/21/2023   Procedure: ATRIAL FIBRILLATION ABLATION;  Surgeon: Mike Byes, MD;  Location: [facility] INVASIVE CV LAB;  Service: Cardiovascular;  Laterality: N/A;   COLONOSCOPY WITH PROPOFOL  N/A 05/30/2015   Procedure: COLONOSCOPY WITH PROPOFOL ;  Surgeon: Mike Click, MD;  Location: [facility] ENDOSCOPY;  Service: Endoscopy;  Laterality: N/A;    Home Medications:  Allergies as of 01/29/2024       Reactions   Pear Other (See Comments)   Other reaction(s): Unknown        Medication List        Accurate as of Jan 29, 2024  4:19 PM. If you have any questions, ask your nurse or doctor.          allopurinol 100 MG tablet Commonly known as:  ZYLOPRIM Take 100 mg by mouth daily.   apixaban  5 MG Tabs tablet Commonly known as: Eliquis  Take 1 tablet (5 mg total) by mouth 2 (two) times daily.   atorvastatin 10 MG tablet Commonly known as: LIPITOR Take 10 mg by mouth daily.   azelastine 0.1 % nasal spray Commonly known as: ASTELIN Place 1 spray into both nostrils 2 (two) times daily. Use in each nostril as directed   brimonidine 0.1 % Soln Commonly known as: ALPHAGAN P Place 1 drop into the right eye in the morning and at bedtime.   colchicine  0.6 MG tablet Take 0.6 mg by mouth daily.   dorzolamide 2 % ophthalmic solution Commonly known as: TRUSOPT Place 1 drop into the right eye 2 (two) times daily.   Fish Oil 1000 MG Caps Take 2,000 mg by mouth daily.   fluticasone 50 MCG/ACT nasal spray Commonly known as: FLONASE Place 1 spray into both nostrils daily.   Fluticasone-Salmeterol 250-50 MCG/DOSE Aepb Commonly known as: ADVAIR Inhale 1 puff into the lungs 2 (two) times daily.   Jardiance 25 MG Tabs tablet Generic drug: empagliflozin Take 25 mg by mouth 2 (two) times daily.   metoprolol  succinate 25 MG 24 hr tablet Commonly known as: Toprol  XL Take 1 tablet (25 mg total) by mouth daily.   metoprolol  tartrate 25 MG tablet Commonly known as: LOPRESSOR  TAKE 1 TABLET BY  MOUTH TWICE DAILY AS NEEDED FOR TACHYCARDIA( RAPID HEART BEAT GREATER THAN 100)   MULTIVITAMIN MEN 50+ PO Take 1 tablet by mouth daily.   Refresh Tears 0.5 % Soln Generic drug: carboxymethylcellulose Place 1 drop into the left eye as needed (dry eyes).   Rocklatan 0.02-0.005 % Soln Generic drug: Netarsudil-Latanoprost Place 1 drop into the right eye at bedtime.   telmisartan 20 MG tablet Commonly known as: MICARDIS Take 20 mg by mouth daily.        Allergies:  Allergies  Allergen Reactions   Pear Other (See Comments)    Other reaction(s): Unknown    Family History: Family History  Problem Relation Age of Onset   High blood  pressure Mother    High blood pressure Father    Diabetes Father    Asthma Sister     Social History:  reports that he has never smoked. He has never used smokeless tobacco. He reports current alcohol use. He reports that he does not use drugs.   Physical Exam: BP (!) 155/86   Pulse 81   Ht 6\' 5"  (1.956 m)   Wt 275 lb (124.7 kg)   BMI 32.61 kg/m   Constitutional:  Alert and oriented, No acute distress. HEENT: [place] AT Respiratory: Normal respiratory effort, no increased work of breathing. GU: Prostate 50 grams, smooth without nodules or induratio Psychiatric: Normal mood and affect.   Assessment & Plan:    1. Elevated PSA Although PSA is a prostate cancer screening test he was informed that cancer is not the most common cause of an elevated PSA. Other potential causes including BPH and inflammation were discussed.  He was informed that the only way to adequately diagnose prostate cancer would be transrectal ultrasound and biopsy of the prostate. The procedure was discussed including potential risks of bleeding and infection/sepsis. He was also informed that a negative biopsy does not conclusively rule out the possibility that prostate cancer may be present and that continued monitoring is required.  The use of newer adjunctive blood and urine tests to predict the probability of high-grade prostate cancer were discussed. The use of multiparametric prostate MRI to evaluate for lesions suspicious for high grade prostate cancer and aid in targeted bx was reviewed.  Continued periodic surveillance was also discussed. PSA repeated today to rule-out transient elevation, secondary to prostatic inflammation on his most recent level. If PSA remains elevated, will schedule prostate MRI.  I have reviewed the above documentation for accuracy and completeness, and I agree with the above.   Mike Knapp, MD  [facility] [address] [place], [place]  [address] [phone]

## 2024-01-30 ENCOUNTER — Ambulatory Visit: Payer: Self-pay | Admitting: Urology

## 2024-01-30 ENCOUNTER — Other Ambulatory Visit: Payer: Self-pay | Admitting: *Deleted

## 2024-01-30 DIAGNOSIS — R972 Elevated prostate specific antigen [PSA]: Secondary | ICD-10-CM

## 2024-01-30 LAB — PSA: Prostate Specific Ag, Serum: 4.8 ng/mL — ABNORMAL HIGH (ref 0.0–4.0)

## 2024-01-31 NOTE — Telephone Encounter (Signed)
 Left message for patient regarding results and recommendations.  Advised patient would get results and information via mychart.

## 2024-01-31 NOTE — Telephone Encounter (Signed)
-----   Message from Kizzie Perks [facility] sent at 01/30/2024  7:14 AM EDT ----- Repeat PSA remains elevated at 4.8.  We discussed proceeding with prostate MRI if it remained elevated.  Please place order and will notify with results

## 2024-02-20 ENCOUNTER — Ambulatory Visit
Admission: RE | Admit: 2024-02-20 | Discharge: 2024-02-20 | Disposition: A | Discharge status: Home / Self Care | Source: Ambulatory Visit | Attending: Urology | Admitting: Urology

## 2024-02-20 DIAGNOSIS — R972 Elevated prostate specific antigen [PSA]: Secondary | ICD-10-CM | POA: Insufficient documentation

## 2024-02-20 MED ORDER — GADOBUTROL 1 MMOL/ML IV SOLN
10.0000 mL | Freq: Once | INTRAVENOUS | Status: AC | PRN
Start: 2024-02-20 — End: 2024-02-20
  Administered 2024-02-20: 10 mL via INTRAVENOUS

## 2024-02-24 ENCOUNTER — Ambulatory Visit: Payer: Self-pay | Admitting: Urology

## 2024-02-25 ENCOUNTER — Encounter: Payer: Self-pay | Admitting: Podiatry

## 2024-03-02 NOTE — Discharge Instructions (Signed)

## 2024-03-03 ENCOUNTER — Other Ambulatory Visit: Payer: Self-pay | Admitting: Cardiology

## 2024-03-03 DIAGNOSIS — I480 Paroxysmal atrial fibrillation: Secondary | ICD-10-CM

## 2024-03-04 ENCOUNTER — Ambulatory Visit: Payer: Self-pay | Admitting: Anesthesiology

## 2024-03-04 ENCOUNTER — Other Ambulatory Visit: Payer: Self-pay

## 2024-03-04 ENCOUNTER — Ambulatory Visit: Payer: Self-pay

## 2024-03-04 ENCOUNTER — Encounter
Admission: RE | Disposition: A | Discharge status: Home / Self Care | Payer: Self-pay | Source: Home / Self Care | Attending: Podiatry

## 2024-03-04 ENCOUNTER — Ambulatory Visit
Admission: RE | Admit: 2024-03-04 | Discharge: 2024-03-04 | Disposition: A | Discharge status: Home / Self Care | Attending: Podiatry | Admitting: Podiatry

## 2024-03-04 ENCOUNTER — Encounter: Payer: Self-pay | Admitting: Podiatry

## 2024-03-04 DIAGNOSIS — Z7984 Long term (current) use of oral hypoglycemic drugs: Secondary | ICD-10-CM | POA: Insufficient documentation

## 2024-03-04 DIAGNOSIS — M899 Disorder of bone, unspecified: Secondary | ICD-10-CM | POA: Diagnosis not present

## 2024-03-04 DIAGNOSIS — I4891 Unspecified atrial fibrillation: Secondary | ICD-10-CM | POA: Insufficient documentation

## 2024-03-04 DIAGNOSIS — I10 Essential (primary) hypertension: Secondary | ICD-10-CM | POA: Diagnosis not present

## 2024-03-04 DIAGNOSIS — M2042 Other hammer toe(s) (acquired), left foot: Secondary | ICD-10-CM | POA: Diagnosis present

## 2024-03-04 DIAGNOSIS — E119 Type 2 diabetes mellitus without complications: Secondary | ICD-10-CM | POA: Insufficient documentation

## 2024-03-04 HISTORY — DX: Prediabetes: R73.03

## 2024-03-04 HISTORY — PX: EXOSTECTECTOMY TOE: SHX6618

## 2024-03-04 HISTORY — PX: HAMMER TOE SURGERY: SHX385

## 2024-03-04 SURGERY — EXCISION, EXOSTOSIS, TOE
Anesthesia: General | Site: Fourth Toe | Laterality: Left

## 2024-03-04 MED ORDER — OXYCODONE HCL 5 MG PO TABS: 5.0000 mg | ORAL_TABLET | Freq: Once | ORAL | Status: DC | PRN

## 2024-03-04 MED ORDER — MIDAZOLAM HCL 2 MG/2ML IJ SOLN
INTRAMUSCULAR | Status: AC
  Filled 2024-03-04: qty 2

## 2024-03-04 MED ORDER — FENTANYL CITRATE PF 50 MCG/ML IJ SOSY: 25.0000 ug | PREFILLED_SYRINGE | INTRAMUSCULAR | Status: DC | PRN

## 2024-03-04 MED ORDER — ONDANSETRON HCL 4 MG PO TABS: 4.0000 mg | ORAL_TABLET | Freq: Four times a day (QID) | ORAL | Status: DC | PRN

## 2024-03-04 MED ORDER — ONDANSETRON HCL 4 MG/2ML IJ SOLN: 4.0000 mg | Freq: Four times a day (QID) | INTRAMUSCULAR | Status: DC | PRN

## 2024-03-04 MED ORDER — CEFAZOLIN SODIUM-DEXTROSE 3-4 GM/150ML-% IV SOLN
3.0000 g | INTRAVENOUS | Status: AC
  Administered 2024-03-04: 2 g via INTRAVENOUS

## 2024-03-04 MED ORDER — MIDAZOLAM HCL 5 MG/5ML IJ SOLN
INTRAMUSCULAR | Status: DC | PRN
  Administered 2024-03-04: 2 mg via INTRAVENOUS

## 2024-03-04 MED ORDER — PROPOFOL 500 MG/50ML IV EMUL
INTRAVENOUS | Status: DC | PRN
Start: 2024-03-04 — End: 2024-03-04
  Administered 2024-03-04: 100 ug/kg/min via INTRAVENOUS

## 2024-03-04 MED ORDER — SODIUM CHLORIDE 0.9 % IV SOLN: INTRAVENOUS | Status: DC

## 2024-03-04 MED ORDER — PROPOFOL 10 MG/ML IV BOLUS
INTRAVENOUS | Status: DC | PRN
  Administered 2024-03-04: 90 mg via INTRAVENOUS

## 2024-03-04 MED ORDER — LIDOCAINE HCL (CARDIAC) PF 100 MG/5ML IV SOSY
PREFILLED_SYRINGE | INTRAVENOUS | Status: DC | PRN
  Administered 2024-03-04: 40 mg via INTRAVENOUS

## 2024-03-04 MED ORDER — OXYCODONE-ACETAMINOPHEN 5-325 MG PO TABS: 1.0000 | ORAL_TABLET | Freq: Four times a day (QID) | ORAL | 0 refills | Status: AC | PRN

## 2024-03-04 MED ORDER — FENTANYL CITRATE (PF) 100 MCG/2ML IJ SOLN
INTRAMUSCULAR | Status: AC
  Filled 2024-03-04: qty 2

## 2024-03-04 MED ORDER — BUPIVACAINE HCL (PF) 0.25 % IJ SOLN
INTRAMUSCULAR | Status: DC | PRN
  Administered 2024-03-04: 5 mL

## 2024-03-04 MED ORDER — METOCLOPRAMIDE HCL 5 MG/ML IJ SOLN: 5.0000 mg | Freq: Three times a day (TID) | INTRAMUSCULAR | Status: DC | PRN

## 2024-03-04 MED ORDER — PROPOFOL 10 MG/ML IV BOLUS
INTRAVENOUS | Status: AC
Start: 2024-03-04 — End: 2024-03-04
  Filled 2024-03-04: qty 40

## 2024-03-04 MED ORDER — OXYCODONE HCL 5 MG/5ML PO SOLN: 5.0000 mg | Freq: Once | ORAL | Status: DC | PRN

## 2024-03-04 MED ORDER — FENTANYL CITRATE (PF) 100 MCG/2ML IJ SOLN
INTRAMUSCULAR | Status: DC | PRN
  Administered 2024-03-04: 25 ug via INTRAVENOUS

## 2024-03-04 MED ORDER — CEFAZOLIN SODIUM-DEXTROSE 2-3 GM-%(50ML) IV SOLR
INTRAVENOUS | Status: AC
  Filled 2024-03-04: qty 50

## 2024-03-04 MED ORDER — METOCLOPRAMIDE HCL 5 MG PO TABS: 5.0000 mg | ORAL_TABLET | Freq: Three times a day (TID) | ORAL | Status: DC | PRN

## 2024-03-04 MED ORDER — BUPIVACAINE LIPOSOME 1.3 % IJ SUSP
INTRAMUSCULAR | Status: DC | PRN
  Administered 2024-03-04 (×2): 5 mL

## 2024-03-04 MED ORDER — BUPIVACAINE LIPOSOME 1.3 % IJ SUSP
INTRAMUSCULAR | Status: AC
  Filled 2024-03-04: qty 10

## 2024-03-04 SURGICAL SUPPLY — 24 items
BENZOIN TINCTURE PRP APPL 2/3 (GAUZE/BANDAGES/DRESSINGS) ×2 IMPLANT
BLADE MINI RND TIP GREEN BEAV (BLADE) IMPLANT
BLADE OSC/SAGITTAL MD 5.5X18 (BLADE) IMPLANT
BNDG COHESIVE 4X5 TAN STRL LF (GAUZE/BANDAGES/DRESSINGS) ×2 IMPLANT
BNDG ESMARCH 4X12 STRL LF (GAUZE/BANDAGES/DRESSINGS) ×2 IMPLANT
BNDG STRETCH 4X75 STRL LF (GAUZE/BANDAGES/DRESSINGS) ×2 IMPLANT
CANISTER SUCT 1200ML W/VALVE (MISCELLANEOUS) ×2 IMPLANT
COVER LIGHT HANDLE UNIVERSAL (MISCELLANEOUS) ×4 IMPLANT
DRAPE FLUOR MINI C-ARM 54X84 (DRAPES) ×2 IMPLANT
DURAPREP 26ML APPLICATOR (WOUND CARE) ×2 IMPLANT
ELECTRODE REM PT RTRN 9FT ADLT (ELECTROSURGICAL) ×2 IMPLANT
GAUZE SPONGE 4X4 12PLY STRL (GAUZE/BANDAGES/DRESSINGS) ×2 IMPLANT
GAUZE XEROFORM 1X8 LF (GAUZE/BANDAGES/DRESSINGS) ×2 IMPLANT
GLOVE BIOGEL PI IND STRL 8 (GLOVE) ×2 IMPLANT
GLOVE SURG SS PI 7.5 STRL IVOR (GLOVE) ×2 IMPLANT
GOWN SPEC L4 XLG W/TWL (GOWN DISPOSABLE) ×2 IMPLANT
GOWN STRL REUS W/ TWL LRG LVL3 (GOWN DISPOSABLE) ×2 IMPLANT
KIT TURNOVER KIT A (KITS) ×2 IMPLANT
NS IRRIG 500ML POUR BTL (IV SOLUTION) ×2 IMPLANT
PACK EXTREMITY ARMC (MISCELLANEOUS) ×2 IMPLANT
PENCIL SMOKE EVACUATOR (MISCELLANEOUS) IMPLANT
STOCKINETTE IMPERVIOUS LG (DRAPES) ×2 IMPLANT
SUT ETHILON 4 0 PS 2 18 (SUTURE) IMPLANT
SUT VIC AB 4-0 SH 27XANBCTRL (SUTURE) IMPLANT

## 2024-03-04 NOTE — Anesthesia Preprocedure Evaluation (Signed)
 Anesthesia Evaluation  Patient identified by MRN, date of birth, ID band Patient awake    Reviewed: Allergy & Precautions, NPO status , Patient's Chart, lab work & pertinent test results  History of Anesthesia Complications Negative for: history of anesthetic complications  Airway Mallampati: III  TM Distance: >3 FB Neck ROM: full    Dental no notable dental hx.    Pulmonary asthma    Pulmonary exam normal        Cardiovascular hypertension, On Medications + dysrhythmias Atrial Fibrillation      Neuro/Psych negative neurological ROS  negative psych ROS   GI/Hepatic negative GI ROS, Neg liver ROS,,,  Endo/Other  diabetes, Type 2    Renal/GU Renal disease  negative genitourinary   Musculoskeletal   Abdominal   Peds  Hematology negative hematology ROS (+)   Anesthesia Other Findings Past Medical History: No date: Asthma No date: Atrial fibrillation (HCC) No date: Hyperlipidemia No date: Hypertension No date: Prediabetes  Past Surgical History: 10/21/2023: ATRIAL FIBRILLATION ABLATION; N/A     Comment:  Procedure: ATRIAL FIBRILLATION ABLATION;  Surgeon:               Cindie Ole DASEN, MD;  Location: [facility] INVASIVE CV LAB;                Service: Cardiovascular;  Laterality: N/A; 05/30/2015: COLONOSCOPY WITH PROPOFOL ; N/A     Comment:  Procedure: COLONOSCOPY WITH PROPOFOL ;  Surgeon: Lamar DASEN Holmes, MD;  Location[facility] ENDOSCOPY;  Service:               Endoscopy;  Laterality: N/A;  BMI    Body Mass Index: 33.20 kg/m      Reproductive/Obstetrics negative OB ROS                             Anesthesia Physical Anesthesia Plan  ASA: 3  Anesthesia Plan: General   Post-op Pain Management: Regional block   Induction: Intravenous  PONV Risk Score and Plan: 1 and Propofol  infusion and TIVA  Airway Management Planned: Natural Airway and Nasal Cannula  Additional  Equipment:   Intra-op Plan:   Post-operative Plan:   Informed Consent: I have reviewed the patients History and Physical, chart, labs and discussed the procedure including the risks, benefits and alternatives for the proposed anesthesia with the patient or authorized representative who has indicated his/her understanding and acceptance.     Dental Advisory Given  Plan Discussed with: Anesthesiologist, CRNA and Surgeon  Anesthesia Plan Comments: (Patient consented for risks of anesthesia including but not limited to:  - adverse reactions to medications - risk of airway placement if required - damage to eyes, teeth, lips or other oral mucosa - nerve damage due to positioning  - sore throat or hoarseness - Damage to heart, brain, nerves, lungs, other parts of body or loss of life  Patient voiced understanding and assent.)       Anesthesia Quick Evaluation

## 2024-03-04 NOTE — Transfer of Care (Signed)
 Immediate Anesthesia Transfer of Care Note  Patient: Mike Miller.  Procedure(s) Performed: EXCISION, EXOSTOSIS, TOE (Left: Fourth Toe) CORRECTION, HAMMER TOE (Left: Fifth Toe)  Patient Location: PACU  Anesthesia Type:General  Level of Consciousness: awake, alert , and oriented  Airway & Oxygen Therapy: Patient Spontanous Breathing  Post-op Assessment: Report given to RN and Post -op Vital signs reviewed and stable  Post vital signs: Reviewed and stable  Last Vitals:  Vitals Value Taken Time  BP 127/74 03/04/24 13:02  Temp 36.1 C 03/04/24 13:01  Pulse 56 03/04/24 13:04  Resp 12 03/04/24 13:04  SpO2 100 % 03/04/24 13:04  Vitals shown include unfiled device data.  Last Pain:  Vitals:   03/04/24 1114  TempSrc: Temporal  PainSc: 0-No pain         Complications: No notable events documented.

## 2024-03-04 NOTE — Addendum Note (Signed)
 Addendum  created 03/04/24 1355 by Jarvis Lew, CRNA   Flowsheet accepted

## 2024-03-04 NOTE — H&P (Signed)
 Mike Miller. is an 73 y.o. male.   Chief Complaint:  <principal problem not specified>   HPI: Painful left 4th and 5th toes  Past Medical History:  Diagnosis Date   Asthma    Atrial fibrillation (HCC)    Hyperlipidemia    Hypertension    Prediabetes     Past Surgical History:  Procedure Laterality Date   ATRIAL FIBRILLATION ABLATION N/A 10/21/2023   Procedure: ATRIAL FIBRILLATION ABLATION;  Surgeon: Cindie Ole DASEN, MD;  Location: [facility] INVASIVE CV LAB;  Service: Cardiovascular;  Laterality: N/A;   COLONOSCOPY WITH PROPOFOL  N/A 05/30/2015   Procedure: COLONOSCOPY WITH PROPOFOL ;  Surgeon: Lamar DASEN Holmes, MD;  Location: [facility] ENDOSCOPY;  Service: Endoscopy;  Laterality: N/A;    Family History  Problem Relation Age of Onset   High blood pressure Mother    High blood pressure Father    Diabetes Father    Asthma Sister    Social History:  reports that he has never smoked. He has never used smokeless tobacco. He reports current alcohol use. He reports that he does not use drugs.  Allergies:  Allergies  Allergen Reactions   Pear Other (See Comments)    Other reaction(s): Unknown    ROS  Medications Prior to Admission  Medication Sig Dispense Refill   apixaban  (ELIQUIS ) 5 MG TABS tablet Take 1 tablet (5 mg total) by mouth 2 (two) times daily. 180 tablet 3   atorvastatin (LIPITOR) 10 MG tablet Take 10 mg by mouth daily.     azelastine (ASTELIN) 0.1 % nasal spray Place 1 spray into both nostrils 2 (two) times daily. Use in each nostril as directed     brimonidine (ALPHAGAN P) 0.1 % SOLN Place 1 drop into the right eye in the morning and at bedtime.     carboxymethylcellulose (REFRESH TEARS) 0.5 % SOLN Place 1 drop into the left eye as needed (dry eyes).     colchicine  0.6 MG tablet Take 0.6 mg by mouth daily.     dorzolamide (TRUSOPT) 2 % ophthalmic solution Place 1 drop into the right eye 2 (two) times daily.     fluticasone (FLONASE) 50 MCG/ACT nasal spray Place 1  spray into both nostrils daily.     Fluticasone-Salmeterol (ADVAIR) 250-50 MCG/DOSE AEPB Inhale 1 puff into the lungs 2 (two) times daily.     JARDIANCE 25 MG TABS tablet Take 25 mg by mouth 2 (two) times daily.     metoprolol  succinate (TOPROL -XL) 25 MG 24 hr tablet TAKE 1 TABLET(25 MG) BY MOUTH DAILY 90 tablet 3   metoprolol  tartrate (LOPRESSOR ) 25 MG tablet TAKE 1 TABLET BY MOUTH TWICE DAILY AS NEEDED FOR TACHYCARDIA( RAPID HEART BEAT GREATER THAN 100) 60 tablet 0   Multiple Vitamins-Minerals (MULTIVITAMIN MEN 50+ PO) Take 1 tablet by mouth daily.     Omega-3 Fatty Acids (FISH OIL) 1000 MG CAPS Take 2,000 mg by mouth daily.     ROCKLATAN 0.02-0.005 % SOLN Place 1 drop into the right eye at bedtime.     telmisartan (MICARDIS) 20 MG tablet Take 20 mg by mouth daily.     allopurinol (ZYLOPRIM) 100 MG tablet Take 100 mg by mouth daily.      Physical Exam: General: Alert and oriented.  No apparent distress Heart:  RRR Lungs: CTA bl. Vascular:  Left foot:Dorsalis Pedis:  present Posterior Tibial:  present  Right foot: Dorsalis Pedis:  present Posterior Tibial:  present Neuro:intact sensation  Derm:Callus between left 4th and  5th toes. Ortho/MS: Hammertoe left 5th and exostosis left 4th toe   No results found for this or any previous visit (from the past 48 hours). DG MINI C-ARM IMAGE ONLY Result Date: 03/04/2024 There is no interpretation for this exam.  This order is for images obtained during a surgical procedure.  Please See Surgeries Tab for more information regarding the procedure.    Blood pressure (!) 144/70, pulse 69, temperature 97.7 F (36.5 C), temperature source Temporal, resp. rate 16, height 6' 5 (1.956 m), weight 128.1 kg, SpO2 98%.  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  No changes in medical history.  [name] A 03/04/2024, 11:52 AM

## 2024-03-04 NOTE — Op Note (Addendum)
 Operative note   Surgeon:Riata Ikeda [employer] logistics/support/administrative officer: None    Preop diagnosis: 1.  Left fifth toe hammertoe 2.  Exostosis lateral fourth toe proximal phalanx    Postop diagnosis: Same    Procedure: 1.  Arthroplasty with derotational left fifth toe PIPJ 2.  Exostectomy lateral base fourth proximal phalanx 3.  Intraoperative fluoroscopy use without assistance of radiologist    EBL: Minimal    Anesthesia:local and IV sedation.  Local consists of a total of 10 cc of Exparel  long-acting anesthetic and 5 cc of 0.25% plain bupivacaine     Hemostasis: Ankle tourniquet inflated to 200 mmHg for approximately 20 minutes    Specimen: None    Complications: None    Operative indications:Mike P Shantel Wesely. is an 73 y.o. that presents today for surgical intervention.  The risks/benefits/alternatives/complications have been discussed and consent has been given.    Procedure:  Patient was brought into the OR and placed on the operating table in thesupine position. After anesthesia was obtained theleft lower extremity was prepped and draped in usual sterile fashion.  Attention was directed initially to the PIPJ of the left fifth toe where 2 semielliptical's incisions were performed and a derotational position.  Full-thickness flap was created.  The a tenotomy of the extensor tendon was performed and the head of the proximal phalanx was exposed.  With a power saw the head of the proximal phalanx was removed at the surgical neck region.  Good reduction was noted.  Attention was then directed to the dorsal lateral aspect of the fourth toe MTPJ where a dorsal incision was performed.  Sharp and blunt dissection Down to the base of the proximal phalanx.  This exposed the lateral aspect of the proximal phalanx.  With a power saw the lateral one third of the base of the proximal phalanx was excised at this time.  Good reduction of the pressure was noted clinically.  All wounds were flushed with copious amounts of  irrigation.  Deep closure of the fourth toe joint was performed with 4-0 Vicryl the subcutaneous tissue and the skin was closed with a 4-0 nylon.  The extensor tendon of the fifth toe was reapproximated with the 4-0 Vicryl as well as the subcutaneous tissue and the fifth toe incision was closed with 4-0 nylon.  Intraoperative fluoroscopy showed reduction of the head of the proximal phalanx and lateral one third of the base of the fourth toe proximal phalanx.    Patient tolerated the procedure and anesthesia well.  Was transported from the OR to the PACU with all vital signs stable and vascular status intact. To be discharged per routine protocol.  Will follow up in approximately 1 week in the outpatient clinic.

## 2024-03-04 NOTE — Anesthesia Postprocedure Evaluation (Signed)
 Anesthesia Post Note  Patient: Mike Miller.  Procedure(s) Performed: EXCISION, EXOSTOSIS, TOE (Left: Fourth Toe) CORRECTION, HAMMER TOE (Left: Fifth Toe)  Patient location during evaluation: PACU Anesthesia Type: General Level of consciousness: awake and alert Pain management: pain level controlled Vital Signs Assessment: post-procedure vital signs reviewed and stable Respiratory status: spontaneous breathing, nonlabored ventilation, respiratory function stable and patient connected to nasal cannula oxygen Cardiovascular status: stable and blood pressure returned to baseline Postop Assessment: no apparent nausea or vomiting Anesthetic complications: no   No notable events documented.   Last Vitals:  Vitals:   03/04/24 1305 03/04/24 1315  BP: 127/74 137/88  Pulse: 71 (!) 52  Resp: 17 15  Temp:    SpO2: 97% 100%    Last Pain:  Vitals:   03/04/24 1315  TempSrc:   PainSc: 0-No pain                 Lendia LITTIE Mae

## 2024-03-05 ENCOUNTER — Encounter: Payer: Self-pay | Admitting: Podiatry

## 2024-04-07 ENCOUNTER — Other Ambulatory Visit: Payer: Self-pay | Admitting: Cardiology

## 2024-04-07 NOTE — Telephone Encounter (Signed)
 Prescription refill request for Eliquis  received. Indication:afib Last office visit:5/25 Scr:1.1  6/25 Age: 73 Weight:128.1  kg  Prescription refilled

## 2024-08-25 ENCOUNTER — Other Ambulatory Visit

## 2024-08-28 ENCOUNTER — Other Ambulatory Visit: Payer: Self-pay | Admitting: Cardiology

## 2024-08-28 ENCOUNTER — Ambulatory Visit: Admitting: Urology

## 2024-08-28 DIAGNOSIS — I480 Paroxysmal atrial fibrillation: Secondary | ICD-10-CM

## 2024-08-28 MED ORDER — APIXABAN 5 MG PO TABS: 5.0000 mg | ORAL_TABLET | Freq: Two times a day (BID) | ORAL | 0 refills | Status: AC

## 2024-08-28 NOTE — Telephone Encounter (Signed)
 Eliquis  5mg  refill request received. Patient is 73 years old, weight-128.1kg, Crea-1.08 on 09/25/23, Diagnosis-Afib, and last seen by Suzann Riddle on 01/21/24. Dose is appropriate based on dosing criteria. Will send in refill to requested pharmacy.

## 2024-09-01 ENCOUNTER — Other Ambulatory Visit: Payer: Self-pay | Admitting: Cardiovascular Disease

## 2024-09-01 ENCOUNTER — Other Ambulatory Visit

## 2024-09-01 DIAGNOSIS — I480 Paroxysmal atrial fibrillation: Secondary | ICD-10-CM

## 2024-09-01 DIAGNOSIS — R972 Elevated prostate specific antigen [PSA]: Secondary | ICD-10-CM

## 2024-09-01 MED ORDER — METOPROLOL SUCCINATE ER 25 MG PO TB24: 25.0000 mg | ORAL_TABLET | Freq: Every day | ORAL | 3 refills | Status: AC

## 2024-09-02 LAB — PSA: Prostate Specific Ag, Serum: 6.4 ng/mL — ABNORMAL HIGH (ref 0.0–4.0)

## 2024-09-09 ENCOUNTER — Encounter: Payer: Self-pay | Admitting: Urology

## 2024-09-09 ENCOUNTER — Ambulatory Visit (INDEPENDENT_AMBULATORY_CARE_PROVIDER_SITE_OTHER): Admitting: Urology

## 2024-09-09 VITALS — BP 170/84 | HR 65 | Wt 280.0 lb

## 2024-09-09 DIAGNOSIS — R972 Elevated prostate specific antigen [PSA]: Secondary | ICD-10-CM | POA: Diagnosis not present

## 2024-09-09 NOTE — Progress Notes (Signed)
 "  09/09/2024 10:17 AM   Beverley SHAUNNA Naida Mickey. 31-Mar-1951 [id number]  Referring provider: Fernande Ophelia JINNY DOUGLAS, MD [address] [facility] [place],  [place] [address]  Chief Complaint  Patient presents with   Elevated PSA   Urologic history: 1.  Elevated PSA Initial visit 01/29/2024 PSA 5.12 Prostate MRI 02/20/2024: 55 g volume; PI-RADS 3 lesion; PSAD 0.09 Surveillance elected  HPI: Socorro Ebron. is a 73 y.o. male who presents for 60-month follow-up.  No changes in voiding pattern since last visit Denies dysuria, gross hematuria No flank, abdominal or pelvic pain PSA 09/01/2024 increased to 6.4  PSA trend    Prostate Specific Ag, Serum  Latest Ref Rng 0.0 - 4.0 ng/mL  11/20/2021 2.80  12/05/2022 4.16  01/22/2023 3.66  11/28/2023 5.12  01/29/2024 4.8  09/01/2024  6.4     PMH: Past Medical History:  Diagnosis Date   Asthma    Atrial fibrillation (HCC)    Hyperlipidemia    Hypertension    Prediabetes     Surgical History: Past Surgical History:  Procedure Laterality Date   ATRIAL FIBRILLATION ABLATION N/A 10/21/2023   Procedure: ATRIAL FIBRILLATION ABLATION;  Surgeon: Cindie Ole DASEN, MD;  Location: [facility];  Service: Cardiovascular;  Laterality: N/A;   COLONOSCOPY WITH PROPOFOL  N/A 05/30/2015   Procedure: COLONOSCOPY WITH PROPOFOL ;  Surgeon: Lamar DASEN Holmes, MD;  Location: [facility] ENDOSCOPY;  Service: Endoscopy;  Laterality: N/A;   EXOSTECTECTOMY TOE Left 03/04/2024   Procedure: EXCISION, EXOSTOSIS, TOE;  Surgeon: [name], DPM;  Location: [facility];  Service: Orthopedics/Podiatry;  Laterality: Left;   HAMMER TOE SURGERY Left 03/04/2024   Procedure: CORRECTION, HAMMER TOE;  Surgeon: [name], DPM;  Location: [facility];  Service: Orthopedics/Podiatry;  Laterality: Left;    Home Medications:  Allergies as of 09/09/2024       Reactions   Pear Other (See Comments)   Other reaction(s): Unknown         Medication List        Accurate as of September 09, 2024 10:17 AM. If you have any questions, ask your nurse or doctor.          allopurinol 100 MG tablet Commonly known as: ZYLOPRIM Take 100 mg by mouth daily.   apixaban  5 MG Tabs tablet Commonly known as: Eliquis  Take 1 tablet (5 mg total) by mouth 2 (two) times daily.   atorvastatin 10 MG tablet Commonly known as: LIPITOR Take 10 mg by mouth daily.   azelastine 0.1 % nasal spray Commonly known as: ASTELIN Place 1 spray into both nostrils 2 (two) times daily. Use in each nostril as directed   brimonidine 0.1 % Soln Commonly known as: ALPHAGAN P Place 1 drop into the right eye in the morning and at bedtime.   colchicine  0.6 MG tablet Take 0.6 mg by mouth daily.   dorzolamide 2 % ophthalmic solution Commonly known as: TRUSOPT Place 1 drop into the right eye 2 (two) times daily.   doxycycline 100 MG capsule Commonly known as: VIBRAMYCIN Take 100 mg by mouth.   Fish Oil 1000 MG Caps Take 2,000 mg by mouth daily.   fluticasone 50 MCG/ACT nasal spray Commonly known as: FLONASE Place 1 spray into both nostrils daily.   Fluticasone-Salmeterol 250-50 MCG/DOSE Aepb Commonly known as: ADVAIR Inhale 1 puff into the lungs 2 (two) times daily.   Jardiance 25 MG Tabs tablet Generic drug: empagliflozin Take 25 mg by mouth 2 (  two) times daily.   metoprolol  succinate 25 MG 24 hr tablet Commonly known as: TOPROL -XL Take 1 tablet (25 mg total) by mouth daily.   metoprolol  tartrate 25 MG tablet Commonly known as: LOPRESSOR  TAKE 1 TABLET BY MOUTH TWICE DAILY AS NEEDED FOR TACHYCARDIA( RAPID HEART BEAT GREATER THAN 100)   MULTIVITAMIN MEN 50+ PO Take 1 tablet by mouth daily.   oxyCODONE -acetaminophen  5-325 MG tablet Commonly known as: Percocet Take 1-2 tablets by mouth every 6 (six) hours as needed for severe pain (pain score 7-10). Max 6 tabs per day   Refresh Tears 0.5 % Soln Generic drug:  carboxymethylcellulose Place 1 drop into the left eye as needed (dry eyes).   Rocklatan 0.02-0.005 % Soln Generic drug: Netarsudil-Latanoprost Place 1 drop into the right eye at bedtime.   telmisartan 20 MG tablet Commonly known as: MICARDIS Take 20 mg by mouth daily.        Allergies: Allergies[1]  Family History: Family History  Problem Relation Age of Onset   High blood pressure Mother    High blood pressure Father    Diabetes Father    Asthma Sister     Social History:  reports that he has never smoked. He has never used smokeless tobacco. He reports current alcohol use. He reports that he does not use drugs.   Physical Exam: BP (!) 170/84   Pulse 65   Wt 280 lb (127 kg)   SpO2 96%   BMI 33.20 kg/m   Constitutional:  Alert, No acute distress. HEENT: [place] AT Respiratory: Normal respiratory effort, no increased work of breathing. Psychiatric: Normal mood and affect.   Assessment & Plan:    1.  Elevated PSA PSA bump to 6.4; PSAD 0.12 We discussed MR fusion biopsy versus repeating the PSA in 2 months as this may be a transient elevation secondary to inflammation He has elected the latter and lab visit scheduled MR fusion biopsy if PSA continues to increase   Glendia JAYSON Barba, MD  [facility] [address] [place], [place] [address] [phone]     [1]  Allergies Allergen Reactions   Pear Other (See Comments)    Other reaction(s): Unknown   "

## 2024-09-24 ENCOUNTER — Other Ambulatory Visit: Payer: Self-pay

## 2024-09-24 ENCOUNTER — Emergency Department
Admission: EM | Admit: 2024-09-24 | Discharge: 2024-09-25 | Disposition: A | Source: Home / Self Care | Attending: Emergency Medicine | Admitting: Emergency Medicine

## 2024-09-24 DIAGNOSIS — Z7951 Long term (current) use of inhaled steroids: Secondary | ICD-10-CM | POA: Insufficient documentation

## 2024-09-24 DIAGNOSIS — Z79899 Other long term (current) drug therapy: Secondary | ICD-10-CM | POA: Insufficient documentation

## 2024-09-24 DIAGNOSIS — Y92481 Parking lot as the place of occurrence of the external cause: Secondary | ICD-10-CM | POA: Insufficient documentation

## 2024-09-24 DIAGNOSIS — J45909 Unspecified asthma, uncomplicated: Secondary | ICD-10-CM | POA: Diagnosis not present

## 2024-09-24 DIAGNOSIS — W01198A Fall on same level from slipping, tripping and stumbling with subsequent striking against other object, initial encounter: Secondary | ICD-10-CM | POA: Insufficient documentation

## 2024-09-24 DIAGNOSIS — J329 Chronic sinusitis, unspecified: Secondary | ICD-10-CM | POA: Insufficient documentation

## 2024-09-24 DIAGNOSIS — W19XXXA Unspecified fall, initial encounter: Secondary | ICD-10-CM

## 2024-09-24 DIAGNOSIS — Z23 Encounter for immunization: Secondary | ICD-10-CM | POA: Insufficient documentation

## 2024-09-24 DIAGNOSIS — S01511A Laceration without foreign body of lip, initial encounter: Secondary | ICD-10-CM | POA: Insufficient documentation

## 2024-09-24 DIAGNOSIS — S0990XA Unspecified injury of head, initial encounter: Secondary | ICD-10-CM | POA: Diagnosis not present

## 2024-09-24 DIAGNOSIS — Z7901 Long term (current) use of anticoagulants: Secondary | ICD-10-CM | POA: Diagnosis not present

## 2024-09-24 DIAGNOSIS — I10 Essential (primary) hypertension: Secondary | ICD-10-CM | POA: Insufficient documentation

## 2024-09-24 DIAGNOSIS — S0993XA Unspecified injury of face, initial encounter: Secondary | ICD-10-CM | POA: Diagnosis present

## 2024-09-24 MED ORDER — BACITRACIN ZINC 500 UNIT/GM EX OINT
TOPICAL_OINTMENT | Freq: Once | CUTANEOUS | Status: AC
  Administered 2024-09-25: 1 via TOPICAL
  Filled 2024-09-24: qty 0.9

## 2024-09-24 MED ORDER — LIDOCAINE-PRILOCAINE 2.5-2.5 % EX CREA
TOPICAL_CREAM | Freq: Once | CUTANEOUS | Status: AC
  Filled 2024-09-24: qty 5

## 2024-09-24 MED ORDER — AMOXICILLIN-POT CLAVULANATE 875-125 MG PO TABS
1.0000 | ORAL_TABLET | Freq: Once | ORAL | Status: AC
  Administered 2024-09-25: 1 via ORAL
  Filled 2024-09-24: qty 1

## 2024-09-24 MED ORDER — TETANUS-DIPHTH-ACELL PERTUSSIS 5-2-15.5 LF-MCG/0.5 IM SUSP
0.5000 mL | Freq: Once | INTRAMUSCULAR | Status: AC
  Administered 2024-09-25: 0.5 mL via INTRAMUSCULAR
  Filled 2024-09-24: qty 0.5

## 2024-09-24 NOTE — ED Provider Notes (Signed)
 "  [facility] Provider Note    Event Date/Time   First MD Initiated Contact with Patient 09/24/24 2334     (approximate)   History   Lip Laceration   HPI  Mike Grobe. is a 74 y.o. male with history of atrial fibrillation, hypertension, hyperlipidemia, prediabetes who presents to the emergency department after he tripped and fell.  He states that he fractured a couple of his upper teeth and they went through his upper lip.  He is on Eliquis .  He denies any facial pain, severe headache, neck pain.  No loss of consciousness.  No preceding symptoms that led to his fall.  No other injury.  Unsure of his last tetanus vaccine.   History provided by patient and wife.    Past Medical History:  Diagnosis Date   Asthma    Atrial fibrillation (HCC)    Hyperlipidemia    Hypertension    Prediabetes     Past Surgical History:  Procedure Laterality Date   ATRIAL FIBRILLATION ABLATION N/A 10/21/2023   Procedure: ATRIAL FIBRILLATION ABLATION;  Surgeon: Cindie Ole DASEN, MD;  Location: [facility] INVASIVE CV LAB;  Service: Cardiovascular;  Laterality: N/A;   COLONOSCOPY WITH PROPOFOL  N/A 05/30/2015   Procedure: COLONOSCOPY WITH PROPOFOL ;  Surgeon: Lamar DASEN Holmes, MD;  Location: [facility] ENDOSCOPY;  Service: Endoscopy;  Laterality: N/A;   EXOSTECTECTOMY TOE Left 03/04/2024   Procedure: EXCISION, EXOSTOSIS, TOE;  Surgeon: [name], DPM;  Location: [facility];  Service: Orthopedics/Podiatry;  Laterality: Left;   HAMMER TOE SURGERY Left 03/04/2024   Procedure: CORRECTION, HAMMER TOE;  Surgeon: [name], DPM;  Location: [facility];  Service: Orthopedics/Podiatry;  Laterality: Left;    MEDICATIONS:  Prior to Admission medications  Medication Sig Start Date End Date Taking? Authorizing Provider  allopurinol (ZYLOPRIM) 100 MG tablet Take 100 mg by mouth daily. 11/19/23 09/09/24  [provider]  apixaban  (ELIQUIS ) 5 MG TABS tablet Take 1  tablet (5 mg total) by mouth 2 (two) times daily. 08/28/24   Cindie Ole DASEN, MD  atorvastatin (LIPITOR) 10 MG tablet Take 10 mg by mouth daily.    [provider]  azelastine (ASTELIN) 0.1 % nasal spray Place 1 spray into both nostrils 2 (two) times daily. Use in each nostril as directed    [provider]  brimonidine (ALPHAGAN P) 0.1 % SOLN Place 1 drop into the right eye in the morning and at bedtime.    [provider]  carboxymethylcellulose (REFRESH TEARS) 0.5 % SOLN Place 1 drop into the left eye as needed (dry eyes).    [provider]  colchicine  0.6 MG tablet Take 0.6 mg by mouth daily.    [provider]  dorzolamide (TRUSOPT) 2 % ophthalmic solution Place 1 drop into the right eye 2 (two) times daily. 07/03/23   [provider]  fluticasone (FLONASE) 50 MCG/ACT nasal spray Place 1 spray into both nostrils daily.    [provider]  Fluticasone-Salmeterol (ADVAIR) 250-50 MCG/DOSE AEPB Inhale 1 puff into the lungs 2 (two) times daily.    [provider]  JARDIANCE 25 MG TABS tablet Take 25 mg by mouth 2 (two) times daily. 09/30/23   [provider]  metoprolol  succinate (TOPROL -XL) 25 MG 24 hr tablet Take 1 tablet (25 mg total) by mouth daily. 09/01/24   Gollan, Timothy J, MD  metoprolol  tartrate (LOPRESSOR ) 25 MG tablet TAKE 1 TABLET BY MOUTH TWICE DAILY AS NEEDED FOR TACHYCARDIA(  RAPID HEART BEAT GREATER THAN 100) 07/22/23   Riddle, Suzann, NP  Multiple Vitamins-Minerals (MULTIVITAMIN MEN 50+ PO) Take 1 tablet by mouth daily.    [provider]  Omega-3 Fatty Acids (FISH OIL) 1000 MG CAPS Take 2,000 mg by mouth daily.    [provider]  oxyCODONE -acetaminophen  (PERCOCET) 5-325 MG tablet Take 1-2 tablets by mouth every 6 (six) hours as needed for severe pain (pain score 7-10). Max 6 tabs per day 03/04/24   [name], DPM  ROCKLATAN 0.02-0.005 % SOLN Place 1 drop into the right eye at  bedtime.    [provider]  telmisartan (MICARDIS) 20 MG tablet Take 20 mg by mouth daily.    [provider]    Physical Exam   Triage Vital Signs: ED Triage Vitals  Encounter Vitals Group     BP 09/24/24 2037 (!) 135/91     Girls Systolic BP Percentile --      Girls Diastolic BP Percentile --      Boys Systolic BP Percentile --      Boys Diastolic BP Percentile --      Pulse Rate 09/24/24 2034 73     Resp 09/24/24 2034 18     Temp 09/24/24 2034 98.8 F (37.1 C)     Temp Source 09/24/24 2034 Oral     SpO2 09/24/24 2034 97 %     Weight 09/24/24 2032 280 lb (127 kg)     Height 09/24/24 2032 6' 5 (1.956 m)     Head Circumference --      Peak Flow --      Pain Score 09/24/24 2031 3     Pain Loc --      Pain Education --      Exclude from Growth Chart --     Most recent vital signs: Vitals:   09/24/24 2034 09/24/24 2037  BP:  (!) 135/91  Pulse: 73   Resp: 18   Temp: 98.8 F (37.1 C)   SpO2: 97%      CONSTITUTIONAL: Alert, responds appropriately to questions. Well-appearing; well-nourished; GCS 15 HEAD: Normocephalic; atraumatic EYES: Conjunctivae clear, PERRL, EOMI ENT: normal nose; no rhinorrhea; moist mucous membranes; pharynx without lesions noted; upper right incisor and canine with fracture but no loose teeth; no septal hematoma, no epistaxis; no facial deformity or bony tenderness, 3 cm laceration to the upper lip that does cross the vermilion border that is through and through NECK: Supple, no midline spinal tenderness, step-off or deformity; trachea midline CARD: RRR; S1 and S2 appreciated; no murmurs, no clicks, no rubs, no gallops RESP: Normal chest excursion without splinting or tachypnea; breath sounds clear and equal bilaterally; no wheezes, no rhonchi, no rales; no hypoxia or respiratory distress CHEST:  chest wall stable, no crepitus or ecchymosis or deformity, nontender to palpation; no flail chest ABD/GI: Non-distended; soft,  non-tender, no rebound, no guarding; no ecchymosis or other lesions noted PELVIS:  stable, nontender to palpation BACK:  The back appears normal; no midline spinal tenderness, step-off or deformity EXT: Normal ROM in all joints; no edema; normal capillary refill; no cyanosis, no bony tenderness or bony deformity of patient's extremities, no joint effusions, compartments are soft, extremities are warm and well-perfused, no ecchymosis SKIN: Normal color for age and race; warm NEURO: No facial asymmetry, normal speech, moving all extremities equally, normal gait  ED Results / Procedures / Treatments   LABS: (all labs ordered are listed, but only abnormal results are displayed) Labs  Reviewed - No data to display   EKG:    RADIOLOGY: My personal review and interpretation of imaging: CT scans show no acute traumatic injury.  I have personally reviewed all radiology reports. CT Maxillofacial Wo Contrast Result Date: 09/25/2024 EXAM: CT OF THE FACE WITHOUT CONTRAST 09/25/2024 12:28:43 AM TECHNIQUE: CT of the face was performed without the administration of intravenous contrast. Multiplanar reformatted images are provided for review. Automated exposure control, iterative reconstruction, and/or weight based adjustment of the mA/kV was utilized to reduce the radiation dose to as low as reasonably achievable. COMPARISON: None available. CLINICAL HISTORY: Facial trauma, blunt. FINDINGS: FACIAL BONES: No acute facial fracture. No mandibular dislocation. No suspicious bone lesion. ORBITS: Globes are intact. No acute traumatic injury. No inflammatory change. Right scleral buckle. Left lens replacement. SINUSES AND MASTOIDS: Complete opacification of bilateral frontal sinuses. Near complete opacification of ethmoid air cells. Complete opacification of sphenoid sinuses. Near complete opacification of left maxillary sinus. Moderate mucosal thickening of right maxillary sinus. SOFT TISSUES: No acute abnormality.  IMPRESSION: 1. No acute facial fracture. 2. Paranasal sinus disease. Electronically signed by: Morgane Naveau MD 09/25/2024 12:38 AM EST RP Workstation: [id number]   CT Cervical Spine Wo Contrast Result Date: 09/25/2024 EXAM: CT CERVICAL SPINE WITHOUT CONTRAST 09/25/2024 12:28:43 AM TECHNIQUE: CT of the cervical spine was performed without the administration of intravenous contrast. Multiplanar reformatted images are provided for review. Automated exposure control, iterative reconstruction, and/or weight based adjustment of the mA/kV was utilized to reduce the radiation dose to as low as reasonably achievable. COMPARISON: None available. CLINICAL HISTORY: Neck trauma (Age >= 65y). FINDINGS: BONES AND ALIGNMENT: No acute fracture or traumatic malalignment. DEGENERATIVE CHANGES: Multilevel mild degenerative spine. SOFT TISSUES: No prevertebral soft tissue swelling. IMPRESSION: 1. No evidence of acute traumatic injury. Electronically signed by: Morgane Naveau MD 09/25/2024 12:35 AM EST RP Workstation: [id number]   CT HEAD WO CONTRAST ( ) Result Date: 09/25/2024 EXAM: CT HEAD WITHOUT CONTRAST 09/25/2024 12:28:43 AM TECHNIQUE: CT of the head was performed without the administration of intravenous contrast. Automated exposure control, iterative reconstruction, and/or weight based adjustment of the mA/kV was utilized to reduce the radiation dose to as low as reasonably achievable. COMPARISON: None available. CLINICAL HISTORY: Facial trauma, blunt; Fall on Eliquis . FINDINGS: BRAIN AND VENTRICLES: No acute hemorrhage. No evidence of acute infarct. No hydrocephalus. No extra-axial collection. No mass effect or midline shift. Partial empty sella. Atherosclerotic calcifications in skull base vessels. ORBITS: Right scleral band. Right scleral buckle. Left lens replacement. SINUSES: Complete opacification of bilateral frontal sinuses. Near complete opacification of ethmoid air cells. Complete opacification of sphenoid  sinuses. Near complete opacification of left maxillary sinus. Moderate mucosal thickening of right maxillary sinus. SOFT TISSUES AND SKULL: No acute soft tissue abnormality. No skull fracture. IMPRESSION: 1. No acute intracranial abnormality. 2. Paranasal sinus disease. Electronically signed by: Morgane Naveau MD 09/25/2024 12:33 AM EST RP Workstation: [id number]     PROCEDURES:  Critical Care performed: No   LACERATION REPAIR Performed by: Josette Sink Authorized by: Josette Sink Consent: Verbal consent obtained. Risks and benefits: risks, benefits and alternatives were discussed Consent given by: patient Patient identity confirmed: provided demographic data Prepped and Draped in normal sterile fashion Wound explored  Laceration Location: Lip  Laceration Length: 3cm  No Foreign Bodies seen or palpated  Anesthesia: local infiltration  Local anesthetic: lidocaine  1% without epinephrine  Anesthetic total: 3 ml  Irrigation method: syringe Amount of cleaning: standard  Skin closure: Superficial  Number of sutures: 3  Technique: Area anesthetized using lidocaine  1% without. Wound irrigated copiously with sterile saline. Wound then cleaned with Betadine and draped in sterile fashion. Wound closed using 3 simple interrupted sutures with 6-0 Vicryl.  Bacitracinapplied. Good wound approximation and hemostasis achieved.    Patient tolerance: Patient tolerated the procedure well with no immediate complications.   Procedures    IMPRESSION / MDM / ASSESSMENT AND PLAN / ED COURSE  I reviewed the triage vital signs and the nursing notes.  Patient here with mechanical fall with laceration to the lip.  Did hit his head but no loss of consciousness.  He is on blood thinners.  The patient is on the cardiac monitor to evaluate for evidence of arrhythmia and/or significant heart rate changes.   DIFFERENTIAL DIAGNOSIS (includes but not limited to):   Lip laceration, dental injury,  less likely skull fracture, intracranial hemorrhage, cervical spine fracture, doubt facial fracture  Patient's presentation is most consistent with acute presentation with potential threat to life or bodily function.  PLAN: Will obtain CT of the head, face and cervical spine.  Will clean and repair his wound.  Will update his tetanus vaccine.  Given laceration is through and through will likely put him on antibiotics.  He will follow-up with his dentist for fractured teeth.  No loose teeth on exam.   MEDICATIONS GIVEN IN ED: Medications  lidocaine -prilocaine  (EMLA ) cream ( Topical Given 09/25/24 0017)  bacitracin  ointment (1 Application Topical Given 09/25/24 0017)  Tdap (ADACEL ) injection 0.5 mL (0.5 mLs Intramuscular Given 09/25/24 0018)  amoxicillin -clavulanate (AUGMENTIN ) 875-125 MG per tablet 1 tablet (1 tablet Oral Given 09/25/24 0016)  lidocaine  (PF) (XYLOCAINE ) 1 % injection 2 mL (2 mLs Other Given 09/25/24 0141)     ED COURSE: CT scans reviewed and interpreted by myself and the radiologist and showed no acute traumatic injury.  He does have signs of paranasal sinus disease.  Will discharge on Augmentin  given significant opacification of all of his sinuses.  Does appear he recently had influenza and has had some residual congestion.    Laceration to the lip has been cleaned and repaired.  Will discharge with Peridex  mouthwash as well for the wound in his mouth.  Discussed with him that this will heal quickly and is already well-approximated and hemostatic so does not need to be sutured.  There are not any pieces of teeth noted in the wound.  Discussed head injury return precautions and supportive care instructions.  Patient and wife are comfortable with this plan.   At this time, I do not feel there is any life-threatening condition present. I reviewed all nursing notes, vitals, pertinent previous records.  All lab and urine results, EKGs, imaging ordered have been independently reviewed  and interpreted by myself.  I reviewed all available radiology reports from any imaging ordered this visit.  Based on my assessment, I feel the patient is safe to be discharged home without further emergent workup and can continue workup as an outpatient as needed. Discussed all findings, treatment plan as well as usual and customary return precautions.  They verbalize understanding and are comfortable with this plan.  Outpatient follow-up has been provided as needed.  All questions have been answered.    CONSULTS:  none   OUTSIDE RECORDS REVIEWED: Reviewed last internal medicine notes.       FINAL CLINICAL IMPRESSION(S) / ED DIAGNOSES   Final diagnoses:  Fall, initial encounter  Injury of head, initial encounter  Lip laceration, initial encounter  Sinusitis, unspecified  chronicity, unspecified location     Rx / DC Orders   ED Discharge Orders          Ordered    amoxicillin -clavulanate (AUGMENTIN ) 875-125 MG tablet  2 times daily        09/25/24 0129    chlorhexidine  (PERIDEX ) 0.12 % solution  2 times daily        09/25/24 0144             Note:  This document was prepared using Dragon voice recognition software and may include unintentional dictation errors.   Kia Stavros, Josette SAILOR, DO 09/25/24 [phone]  "

## 2024-09-24 NOTE — ED Triage Notes (Addendum)
 Patient ambulatory to triage with steady gait, without difficulty or distress noted; pt st PTA tripped in parking lot and his tooth went thru his lower lip; denies any other c/o or injuries

## 2024-09-25 ENCOUNTER — Emergency Department

## 2024-09-25 MED ORDER — LIDOCAINE HCL (PF) 1 % IJ SOLN
2.0000 mL | Freq: Once | INTRAMUSCULAR | Status: AC
  Administered 2024-09-25: 2 mL
  Filled 2024-09-25: qty 5

## 2024-09-25 MED ORDER — AMOXICILLIN-POT CLAVULANATE 875-125 MG PO TABS: 1.0000 | ORAL_TABLET | Freq: Two times a day (BID) | ORAL | 0 refills | Status: AC

## 2024-09-25 MED ORDER — CHLORHEXIDINE GLUCONATE 0.12 % MT SOLN: 15.0000 mL | Freq: Two times a day (BID) | OROMUCOSAL | 0 refills | Status: AC

## 2024-09-25 NOTE — Discharge Instructions (Signed)
 You may take over the counter Tylenol  1000 mg every 6 hours as needed for pain.  You may clean your wound gently daily with soap and warm water and then pat dry.  Your sutures are absorbable and will come out on their own in the next 2 to 3 weeks.  If they have not come out by the end of 3 weeks, you may return to the emergency department, urgent care or your PCP for removal.  Please do not apply over-the-counter antibiotic ointment as this can breakdown absorbable sutures too quickly.  Also many people have reactions to these ointments and can delay wound healing rather than help it.  Please keep your wound clean and dry.  You may leave it uncovered or cover it with a dressing daily.  All wounds have the risk of becoming infected.  We do everything sterilely to try to prevent this and clean the wound with saline, Betadine or chlorhexidine .  If your wound becomes red, warm, draining pus, has a foul odor or you have a fever of 100.4 or higher, please return to the emergency department or urgent care.  All wounds also have the risk of scarring.  I recommend once your wound has completely healed and your sutures have come out or have been removed that you make sure to keep sunscreen on your scars every day for at least the next year.  You may also use over-the-counter Mederma once the wound is healed which can help with scar formation.

## 2024-11-09 ENCOUNTER — Other Ambulatory Visit
